# Patient Record
Sex: Male | Born: 1960 | Hispanic: Yes | Marital: Single | Smoking: Never smoker
Health system: Southern US, Community
[De-identification: ages and names within clinical notes are randomized; demographics above are authoritative.]

## PROBLEM LIST (undated history)

## (undated) DIAGNOSIS — E119 Type 2 diabetes mellitus without complications: Secondary | ICD-10-CM

## (undated) HISTORY — PX: APPENDECTOMY: SHX54

---

## 2019-11-02 ENCOUNTER — Encounter (HOSPITAL_COMMUNITY): Payer: Self-pay

## 2019-11-02 ENCOUNTER — Emergency Department (HOSPITAL_COMMUNITY)
Admission: EM | Admit: 2019-11-02 | Discharge: 2019-11-03 | Disposition: A | Discharge status: Home / Self Care | Payer: 59 | Attending: Emergency Medicine | Admitting: Emergency Medicine

## 2019-11-02 ENCOUNTER — Other Ambulatory Visit: Payer: Self-pay

## 2019-11-02 DIAGNOSIS — M609 Myositis, unspecified: Secondary | ICD-10-CM

## 2019-11-02 DIAGNOSIS — R531 Weakness: Secondary | ICD-10-CM | POA: Diagnosis present

## 2019-11-02 DIAGNOSIS — R7401 Elevation of levels of liver transaminase levels: Secondary | ICD-10-CM

## 2019-11-02 DIAGNOSIS — E119 Type 2 diabetes mellitus without complications: Secondary | ICD-10-CM | POA: Diagnosis not present

## 2019-11-02 HISTORY — DX: Type 2 diabetes mellitus without complications: E11.9

## 2019-11-02 LAB — CBC
HCT: 49.7 % (ref 39.0–52.0)
Hemoglobin: 15.7 g/dL (ref 13.0–17.0)
MCH: 27.2 pg (ref 26.0–34.0)
MCHC: 31.6 g/dL (ref 30.0–36.0)
MCV: 86 fL (ref 80.0–100.0)
Platelets: 383 10*3/uL (ref 150–400)
RBC: 5.78 MIL/uL (ref 4.22–5.81)
RDW: 15.2 % (ref 11.5–15.5)
WBC: 9.6 10*3/uL (ref 4.0–10.5)
nRBC: 0 % (ref 0.0–0.2)

## 2019-11-02 LAB — BASIC METABOLIC PANEL
Anion gap: 8 (ref 5–15)
BUN: 7 mg/dL (ref 6–20)
CO2: 24 mmol/L (ref 22–32)
Calcium: 9.2 mg/dL (ref 8.9–10.3)
Chloride: 105 mmol/L (ref 98–111)
Creatinine, Ser: 0.38 mg/dL — ABNORMAL LOW (ref 0.61–1.24)
GFR calc Af Amer: >60 mL/min (ref >60)
GFR calc non Af Amer: >60 mL/min (ref >60)
Glucose, Bld: 124 mg/dL — ABNORMAL HIGH (ref 70–99)
Potassium: 4.5 mmol/L (ref 3.5–5.1)
Sodium: 137 mmol/L (ref 135–145)

## 2019-11-02 LAB — URINALYSIS, ROUTINE W REFLEX MICROSCOPIC
Bilirubin Urine: NEGATIVE
Glucose, UA: NEGATIVE mg/dL
Hgb urine dipstick: NEGATIVE
Ketones, ur: NEGATIVE mg/dL
Leukocytes,Ua: NEGATIVE
Nitrite: NEGATIVE
Protein, ur: NEGATIVE mg/dL
Specific Gravity, Urine: 1.017 (ref 1.005–1.030)
pH: 5 (ref 5.0–8.0)

## 2019-11-02 NOTE — ED Triage Notes (Signed)
Pt arrives POV for eval of weakness x 1.5 months. Pt reports he has difficulty lifting R arm for 1 month as well, no pain just cannot get above head. Pt also reports weakess when rising to standing position. Denies dizziness,N/T.

## 2019-11-03 ENCOUNTER — Emergency Department (HOSPITAL_COMMUNITY): Payer: 59

## 2019-11-03 DIAGNOSIS — R531 Weakness: Secondary | ICD-10-CM

## 2019-11-03 LAB — HEPATITIS PANEL, ACUTE
HCV Ab: NONREACTIVE
Hep A IgM: NONREACTIVE
Hep B C IgM: NONREACTIVE
Hepatitis B Surface Ag: NONREACTIVE

## 2019-11-03 LAB — TSH: TSH: 3.578 u[IU]/mL (ref 0.350–4.500)

## 2019-11-03 LAB — CSF CELL COUNT WITH DIFFERENTIAL
RBC Count, CSF: 9 /mm3 — ABNORMAL HIGH
RBC Count, CSF: 2000 /mm3 — ABNORMAL HIGH
Tube #: 4
Tube #: 1
WBC, CSF: 1 /mm3 (ref 0–5)
WBC, CSF: 4 /mm3 (ref 0–5)

## 2019-11-03 LAB — HEPATIC FUNCTION PANEL
ALT: 391 U/L — ABNORMAL HIGH (ref 0–44)
AST: 268 U/L — ABNORMAL HIGH (ref 15–41)
Albumin: 3.3 g/dL — ABNORMAL LOW (ref 3.5–5.0)
Alkaline Phosphatase: 70 U/L (ref 38–126)
Bilirubin, Direct: <0.1 mg/dL (ref 0.0–0.2)
Total Bilirubin: 1 mg/dL (ref 0.3–1.2)
Total Protein: 6.7 g/dL (ref 6.5–8.1)

## 2019-11-03 LAB — CK: Total CK: 5018 U/L — ABNORMAL HIGH (ref 49–397)

## 2019-11-03 LAB — ACETAMINOPHEN LEVEL: Acetaminophen (Tylenol), Serum: <10 ug/mL — ABNORMAL LOW (ref 10–30)

## 2019-11-03 LAB — CBG MONITORING, ED: Glucose-Capillary: 85 mg/dL (ref 70–99)

## 2019-11-03 LAB — SEDIMENTATION RATE: Sed Rate: 12 mm/hr (ref 0–16)

## 2019-11-03 LAB — HEMOGLOBIN A1C
Hgb A1c MFr Bld: 6.8 % — ABNORMAL HIGH (ref 4.8–5.6)
Mean Plasma Glucose: 148.46 mg/dL

## 2019-11-03 LAB — PROTEIN, CSF: Total  Protein, CSF: 29 mg/dL (ref 15–45)

## 2019-11-03 LAB — GLUCOSE, CSF: Glucose, CSF: 54 mg/dL (ref 40–70)

## 2019-11-03 LAB — C-REACTIVE PROTEIN: CRP: 0.6 mg/dL (ref <1.0)

## 2019-11-03 MED ORDER — IBUPROFEN 400 MG PO TABS
600.0000 mg | ORAL_TABLET | Freq: Once | ORAL | Status: AC
  Administered 2019-11-03: 600 mg via ORAL
  Filled 2019-11-03: qty 1

## 2019-11-03 MED ORDER — LACTATED RINGERS IV BOLUS
1000.0000 mL | Freq: Once | INTRAVENOUS | Status: AC
  Administered 2019-11-03: 1000 mL via INTRAVENOUS

## 2019-11-03 NOTE — Discharge Planning (Signed)
PT recommends outpatient PT and rolling walker.  RNCM placed referral to OP PT and rolling walker.  OP PT will call pt with appointment.

## 2019-11-03 NOTE — ED Notes (Signed)
CSF Samples transported to Main Lab by this RN

## 2019-11-03 NOTE — ED Provider Notes (Signed)
Emergency Department Provider Note  I have reviewed the triage vital signs and the nursing notes.  HISTORY  Chief Complaint Weakness   HPI Maurice Jones is a 59 y.o. male with history of diabetes who presents to the emergency department today secondary to weakness.  Patient states that about 4 months ago he noticed some changes in his overall strength but then a couple months ago when he really started noticing weakness in his thighs.  States he progressively worsened and then recently in the last few weeks he started noticing weakness in his upper extremities as well.  States he had a significant bout of weight loss since Thanksgiving and his sister produces pictures showing that it does appear he is lost at least 30 to 40 pounds if not more since that time.  Patient has no pain anywhere.  No headaches.  No difficulty swallowing or speaking.  No vision changes.  No recent illnesses no illnesses preceding this symptomatology.  No trauma.  No history of drug use, tobacco or alcohol.  Who just got here from [place] however no other recent travels.  He does state that he was able to walk pretty easily when he got here was become much more mature for him.  He is also at the point where it is difficult for him to sit up in bed and to slide across the bed.  No other associated or modifying symptoms.    Past Medical History:  Diagnosis Date  . Diabetes mellitus without complication ([facility])     There are no problems to display for this patient.   History reviewed. No pertinent surgical history.  Current Outpatient Rx  . Order #: [id number] Class: Historical Med    Allergies Patient has no known allergies.  History reviewed. No pertinent family history.  Social History Social History   Tobacco Use  . Smoking status: Never Smoker  Substance Use Topics  . Alcohol use: Yes  . Drug use: Not Currently    Review of Systems  All other systems negative except as documented in the HPI.  All pertinent positives and negatives as reviewed in the HPI. ____________________________________________  PHYSICAL EXAM:  VITAL SIGNS: ED Triage Vitals  Enc Vitals Group     BP 11/02/19 1148 106/77     Pulse Rate 11/02/19 1148 88     Resp 11/02/19 1148 18     Temp 11/02/19 1148 98.8 F (37.1 C)     Temp Source 11/02/19 1735 Oral     SpO2 11/02/19 1148 100 %     Weight 11/02/19 1148 165 lb (74.8 kg)     Height 11/02/19 1148 '5\' 4"'  (1.626 m)    Constitutional: Alert and oriented. Well appearing and in no acute distress. Eyes: Conjunctivae are normal. PERRL. EOMI. Head: Atraumatic. Nose: No congestion/rhinnorhea. Mouth/Throat: Mucous membranes are moist.  Oropharynx non-erythematous. Neck: No stridor.  No meningeal signs.   Cardiovascular: Normal rate, regular rhythm. Good peripheral circulation. Grossly normal heart sounds.   Respiratory: Normal respiratory effort.  No retractions. Lungs CTAB. Gastrointestinal: Soft and nontender. No distention.  Musculoskeletal: No lower extremity tenderness nor edema. No gross deformities of extremities. Neurologic:  Normal speech and language.  He has weakness in grip strength, bicep, tricep symmetrically.  Diminished reflexes in patella and Achilles with symmetric diminished strength in bilateral quadriceps. Skin:  Skin is warm, dry and intact. No rash noted.  ____________________________________________   LABS (all labs ordered are listed, but only abnormal results are displayed)  Labs Reviewed  BASIC METABOLIC PANEL - Abnormal; Notable for the following components:      Result Value   Glucose, Bld 124 (*)    Creatinine, Ser 0.38 (*)    All other components within normal limits  URINALYSIS, ROUTINE W REFLEX MICROSCOPIC - Abnormal; Notable for the following components:   APPearance HAZY (*)    All other components within normal limits  CK - Abnormal; Notable for the following components:   Total CK 5,018 (*)    All other components  within normal limits  HEPATIC FUNCTION PANEL - Abnormal; Notable for the following components:   Albumin 3.3 (*)    AST 268 (*)    ALT 391 (*)    All other components within normal limits  CSF CELL COUNT WITH DIFFERENTIAL - Abnormal; Notable for the following components:   Appearance, CSF HAZY (*)    RBC Count, CSF 2,000 (*)    All other components within normal limits  CSF CELL COUNT WITH DIFFERENTIAL - Abnormal; Notable for the following components:   Appearance, CSF CLEAR (*)    RBC Count, CSF 9 (*)    All other components within normal limits  HEMOGLOBIN A1C - Abnormal; Notable for the following components:   Hgb A1c MFr Bld 6.8 (*)    All other components within normal limits  ACETAMINOPHEN LEVEL - Abnormal; Notable for the following components:   Acetaminophen (Tylenol), Serum <10 (*)    All other components within normal limits  CSF CULTURE  CBC  SEDIMENTATION RATE  C-REACTIVE PROTEIN  PROTEIN, CSF  GLUCOSE, CSF  TSH  HEPATITIS PANEL, ACUTE  IGG CSF INDEX  OLIGOCLONAL BANDS, CSF + SERM  CBG MONITORING, ED   ____________________________________________  EKG   EKG Interpretation  Date/Time:  Thursday November 02 2019 11:52:00 EDT Ventricular Rate:  85 PR Interval:  132 QRS Duration: 98 QT Interval:  360 QTC Calculation: 428 R Axis:   -39 Text Interpretation: Normal sinus rhythm Left axis deviation Possible Lateral infarct , age undetermined Abnormal ECG poor data ecg 2/2 artifact, can't read Confirmed by Merrily Pew [phone]) on 11/02/2019 11:12:08 PM       ____________________________________________  RADIOLOGY  MR BRAIN WO CONTRAST  Result Date: 11/03/2019 CLINICAL DATA:  Difficulty lifting right arm for 1 month.  Weakness. EXAM: MRI HEAD WITHOUT CONTRAST MRI CERVICAL SPINE WITHOUT CONTRAST TECHNIQUE: Multiplanar, multiecho pulse sequences of the brain and surrounding structures, and cervical spine, to include the craniocervical junction and cervicothoracic  junction, were obtained without intravenous contrast. COMPARISON:  None. FINDINGS: MRI HEAD FINDINGS BRAIN: No acute infarct, acute hemorrhage or extra-axial collection. Normal white matter signal. Normal volume of CSF spaces. No chronic microhemorrhage. Normal midline structures. VASCULAR: Major flow voids are preserved. SKULL AND UPPER CERVICAL SPINE: Normal calvarium and skull base. Visualized upper cervical spine and soft tissues are normal. SINUSES/ORBITS: No paranasal sinus fluid levels or advanced mucosal thickening. No mastoid or middle ear effusion. Normal orbits. MRI CERVICAL SPINE FINDINGS Alignment: Physiologic. Vertebrae: No fracture, evidence of discitis, or bone lesion. Cord: Normal signal and morphology. Posterior Fossa, vertebral arteries, paraspinal tissues: Negative. Disc levels: C2-3: Normal. C3-4: Small right subarticular disc protrusion. Mild right foraminal stenosis. No central spinal canal stenosis. C4-5: Small disc bulge.  Moderate right foraminal stenosis. C5-6: Small disc bulge.  No stenosis. C6-7: Unremarkable. C7-T1: Normal. T1-2: Normal. IMPRESSION: 1. Normal MRI of the brain. 2. 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 Electronically Signed   By: Cletus Gash.D.  On: 11/03/2019 02:33   MR Cervical Spine Wo Contrast  Result Date: 11/03/2019 CLINICAL DATA:  Difficulty lifting right arm for 1 month.  Weakness. EXAM: MRI HEAD WITHOUT CONTRAST MRI CERVICAL SPINE WITHOUT CONTRAST TECHNIQUE: Multiplanar, multiecho pulse sequences of the brain and surrounding structures, and cervical spine, to include the craniocervical junction and cervicothoracic junction, were obtained without intravenous contrast. COMPARISON:  None. FINDINGS: MRI HEAD FINDINGS BRAIN: No acute infarct, acute hemorrhage or extra-axial collection. Normal white matter signal. Normal volume of CSF spaces. No chronic microhemorrhage. Normal midline structures. VASCULAR: Major flow voids are  preserved. SKULL AND UPPER CERVICAL SPINE: Normal calvarium and skull base. Visualized upper cervical spine and soft tissues are normal. SINUSES/ORBITS: No paranasal sinus fluid levels or advanced mucosal thickening. No mastoid or middle ear effusion. Normal orbits. MRI CERVICAL SPINE FINDINGS Alignment: Physiologic. Vertebrae: No fracture, evidence of discitis, or bone lesion. Cord: Normal signal and morphology. Posterior Fossa, vertebral arteries, paraspinal tissues: Negative. Disc levels: C2-3: Normal. C3-4: Small right subarticular disc protrusion. Mild right foraminal stenosis. No central spinal canal stenosis. C4-5: Small disc bulge.  Moderate right foraminal stenosis. C5-6: Small disc bulge.  No stenosis. C6-7: Unremarkable. C7-T1: Normal. T1-2: Normal. IMPRESSION: 1. Normal MRI of the brain. 2. 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 Electronically Signed   By: Ulyses Jarred M.D.   On: 11/03/2019 02:33   ____________________________________________  PROCEDURES  Procedure(s) performed:   .Lumbar Puncture  Date/Time: 11/03/2019 11:54 PM Performed by: Merrily Pew, MD Authorized by: Merrily Pew, MD   Consent:    Consent obtained:  Verbal   Consent given by:  Patient   Risks discussed:  Bleeding, headache, infection, nerve damage, repeat procedure and pain   Alternatives discussed:  No treatment, delayed treatment and alternative treatment Pre-procedure details:    Procedure purpose:  Diagnostic Anesthesia (see MAR for exact dosages):    Anesthesia method:  Local infiltration   Local anesthetic:  Lidocaine 1% w/o epi Procedure details:    Lumbar space:  L3-L4 interspace   Patient position:  Sitting   Needle gauge:  22   Needle type:  Spinal needle - Quincke tip   Needle length (in):  3.5   Number of attempts:  2   Fluid appearance:  Blood-tinged   Tubes of fluid:  4   Total volume (ml):  6 Post-procedure:    Puncture site:  Adhesive bandage applied and  direct pressure applied   Patient tolerance of procedure:  Tolerated well, no immediate complications   ____________________________________________  INITIAL IMPRESSION / ASSESSMENT AND PLAN / ED COURSE   This patient presents to the ED for concern of weakness, this involves an extensive number of treatment options, and is a complaint that carries with it a high risk of complications and morbidity.  The differential diagnosis includes ALS, GBS, muscular dystrophy, MG, MS.     Lab Tests:   I Ordered, reviewed, and interpreted labs, which included CSF which was unremarkable, ESR, CRP both unremarkable.  Elevated CK around 5000 along with elevated liver enzymes.  Discussed with neurology evaluate the patient that the patient needed likely neuromuscular work-up as an outpatient.  Will refer to rheumatology, neurology and ask case management physical therapy to see the patient here before leaving to see if there is anything they can do to help with mobility at home.  Imaging Studies ordered:   I independently visualized and interpreted imaging MRI brain and C-spine which showed no acute abnormalities  Additional  history obtained:   Additional history obtained from sister  Previous records obtained and reviewed in care everywhere  Consultations Obtained:   I consulted Neurology  and discussed lab and imaging findings  Reevaluation:  After the interventions stated above, I reevaluated the patient and found no significant change. Is able to stand and get around. Still unclear on etiology of symptoms, but does have some inconsistent history making me think it is not as rapidly progressive as he first made it out to be. Neuro recommending PT/OT eval for outpatient treatment. Medicine recommends Rheum consult and we don't have anyone on call in our system, so will refer to Dr. Amil Amen in the community.   A medical screening exam was performed and I feel the patient has had an appropriate  workup for their chief complaint at this time and likelihood of emergent condition existing is low. They have been counseled on decision, discharge, follow up and which symptoms necessitate immediate return to the emergency department. They or their family verbally stated understanding and agreement with plan and discharged in stable condition.   ____________________________________________  FINAL CLINICAL IMPRESSION(S) / ED DIAGNOSES  Final diagnoses:  Weakness  Myositis, unspecified myositis type, unspecified site  Transaminitis    MEDICATIONS GIVEN DURING THIS VISIT:  Medications  lactated ringers bolus 1,000 mL (0 mLs Intravenous Stopped 11/03/19 0704)  ibuprofen (ADVIL) tablet 600 mg (600 mg Oral Given 11/03/19 1002)    NEW OUTPATIENT MEDICATIONS STARTED DURING THIS VISIT:  Discharge Medication List as of 11/03/2019 12:33 PM      Note:  This note was prepared with assistance of Dragon voice recognition software. Occasional wrong-word or sound-a-like substitutions may have occurred due to the inherent limitations of voice recognition software.   Merrily Pew, MD 11/03/19 [phone]

## 2019-11-03 NOTE — ED Notes (Signed)
Pt transported to MRI 

## 2019-11-03 NOTE — ED Notes (Signed)
Pt returned from MRI; MD Aroor contacted regarding pts arrival.

## 2019-11-03 NOTE — Consult Note (Signed)
Requesting Physician: Dr. Clayborne Dana    Chief Complaint: Generalized weakness, muscle twitches, weight loss  History obtained from: Patient and Chart    HPI:                                                                                                                                       Maurice Jones is a 59 y.o. male with past medical history significant for diabetes mellitus on Metformin brought into the emergency department by his daughter for increasing weakness and difficulty walking and getting up from bed.  The patient lives in [place] and visiting his daughters who live in [place].  Patient has been having increased weakness, muscle wasting over the last 6 months.  His friend/fianc has noticed him also having occasional twitches for about a year.  The daughter went to visit him last week in [place] and noticed that he had lost significant muscle mass in his arms and legs and was concerned and brought him to [place].  She states that he was able to walk into the airplane, but since he has been with her he is having increasing difficulty ambulating and needs assistance getting up from the chair.  After getting from the chair he is able to walk without a walker.  Patient denies any sensory symptoms apart from some mild numbness tingling in both his feet.  He also has noticed that he is having pooling of saliva in his throat.  He does not have any difficulty with eating.  Denies double vision, slurred speech.  He has recently started back on Metformin however has been off this medicine for about 2 years.  His blood glucose is 85.     Past Medical History:  Diagnosis Date   Diabetes mellitus without complication ([facility])     History reviewed. No pertinent surgical history.  History reviewed. No pertinent family history. Social History:  reports that he has never smoked. He does not have any smokeless tobacco history on file. He reports current alcohol use. He reports previous  drug use.  Allergies: No Known Allergies  Medications:                                                                                                                        I reviewed home medications  ROS:  14 systems reviewed and negative except above   Examination:                                                                                                      General: Appears well-developed and well-nourished.  Psych: Affect appropriate to situation Eyes: No scleral injection HENT: No OP obstrucion Head: Normocephalic.  Cardiovascular: Normal rate and regular rhythm.  Respiratory: Effort normal and breath sounds normal to anterior ascultation GI: Soft.  No distension. There is no tenderness.  Skin: WDI    Neurological Examination Mental Status: Alert, oriented, thought content appropriate.  Speech fluent without evidence of aphasia. Able to follow 3 step commands without difficulty. Cranial Nerves: II: Visual fields grossly normal,  III,IV, VI: ptosis not present, extra-ocular motions intact bilaterally, pupils equal, round, reactive to light and accommodation V,VII: smile symmetric, facial light touch sensation normal bilaterally VIII: hearing normal bilaterally IX,X: uvula rises symmetrically XI: bilateral shoulder shrug XII: midline tongue extension Motor: Right : Upper extremity   5/5    Left:     Upper extremity   5/5  Lower extremity   4+/5     Lower extremity   4+/5 Tone and bulk: Patient has wasting in bilateral thighs.  No obvious fasciculations noted. Sensory: Pinprick and light touch intact throughout, bilaterally Deep Tendon Reflexes: Absent reflexes throughout (bilateral plantars, ankle, bilateral biceps, triceps and brachioradialis).  No jaw jerk present Plantars: Right: downgoing   Left: downgoing Cerebellar: normal  finger-to-nose, normal rapid alternating movements and normal heel-to-shin test Gait: Not assessed     Lab Results: Basic Metabolic Panel: Recent Labs  Lab 11/02/19 1156  NA 137  K 4.5  CL 105  CO2 24  GLUCOSE 124*  BUN 7  CREATININE 0.38*  CALCIUM 9.2    CBC: Recent Labs  Lab 11/02/19 1156  WBC 9.6  HGB 15.7  HCT 49.7  MCV 86.0  PLT 383    Coagulation Studies: No results for input(s): LABPROT, INR in the last 72 hours.  Imaging: MR BRAIN WO CONTRAST  Result Date: 11/03/2019 CLINICAL DATA:  Difficulty lifting right arm for 1 month.  Weakness. EXAM: MRI HEAD WITHOUT CONTRAST MRI CERVICAL SPINE WITHOUT CONTRAST TECHNIQUE: Multiplanar, multiecho pulse sequences of the brain and surrounding structures, and cervical spine, to include the craniocervical junction and cervicothoracic junction, were obtained without intravenous contrast. COMPARISON:  None. FINDINGS: MRI HEAD FINDINGS BRAIN: No acute infarct, acute hemorrhage or extra-axial collection. Normal white matter signal. Normal volume of CSF spaces. No chronic microhemorrhage. Normal midline structures. VASCULAR: Major flow voids are preserved. SKULL AND UPPER CERVICAL SPINE: Normal calvarium and skull base. Visualized upper cervical spine and soft tissues are normal. SINUSES/ORBITS: No paranasal sinus fluid levels or advanced mucosal thickening. No mastoid or middle ear effusion. Normal orbits. MRI CERVICAL SPINE FINDINGS Alignment: Physiologic. Vertebrae: No fracture, evidence of discitis, or bone lesion. Cord: Normal signal and morphology. Posterior Fossa, vertebral arteries, paraspinal tissues: Negative. Disc levels: C2-3: Normal. C3-4: Small right subarticular disc protrusion. Mild right foraminal stenosis. No central spinal canal stenosis. C4-5: Small disc  bulge.  Moderate right foraminal stenosis. C5-6: Small disc bulge.  No stenosis. C6-7: Unremarkable. C7-T1: Normal. T1-2: Normal. IMPRESSION: 1. Normal MRI of the brain. 2.  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 Electronically Signed   By: Deatra Robinson M.D.   On: 11/03/2019 02:33   MR Cervical Spine Wo Contrast  Result Date: 11/03/2019 CLINICAL DATA:  Difficulty lifting right arm for 1 month.  Weakness. EXAM: MRI HEAD WITHOUT CONTRAST MRI CERVICAL SPINE WITHOUT CONTRAST TECHNIQUE: Multiplanar, multiecho pulse sequences of the brain and surrounding structures, and cervical spine, to include the craniocervical junction and cervicothoracic junction, were obtained without intravenous contrast. COMPARISON:  None. FINDINGS: MRI HEAD FINDINGS BRAIN: No acute infarct, acute hemorrhage or extra-axial collection. Normal white matter signal. Normal volume of CSF spaces. No chronic microhemorrhage. Normal midline structures. VASCULAR: Major flow voids are preserved. SKULL AND UPPER CERVICAL SPINE: Normal calvarium and skull base. Visualized upper cervical spine and soft tissues are normal. SINUSES/ORBITS: No paranasal sinus fluid levels or advanced mucosal thickening. No mastoid or middle ear effusion. Normal orbits. MRI CERVICAL SPINE FINDINGS Alignment: Physiologic. Vertebrae: No fracture, evidence of discitis, or bone lesion. Cord: Normal signal and morphology. Posterior Fossa, vertebral arteries, paraspinal tissues: Negative. Disc levels: C2-3: Normal. C3-4: Small right subarticular disc protrusion. Mild right foraminal stenosis. No central spinal canal stenosis. C4-5: Small disc bulge.  Moderate right foraminal stenosis. C5-6: Small disc bulge.  No stenosis. C6-7: Unremarkable. C7-T1: Normal. T1-2: Normal. IMPRESSION: 1. Normal MRI of the brain. 2. 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 Electronically Signed   By: Deatra Robinson M.D.   On: 11/03/2019 02:33     I have reviewed the above imaging: MRI brain and C-spine shows no acute findings.   ASSESSMENT AND PLAN   59 year old male with diabetes mellitus presents with 62-month history  of progressive weakness, weight loss and muscle wasting (proximal much greater than distal).  Patient areflexic on exam and CK elevated at 5000.  No sensory symptoms and on examination minimal reduced sensation in feet.  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  No compressive or demyelinating lesions. Obtain lumbar puncture to look for albumino-cytological dissociation. PT OT evaluation We will need outpatient EMG, nerve conduction study and possible muscle biopsy. Would hold off starting steroids given chronic process.   Addendum LP CSF protein : 29, Cell count 1- r/o CIDP Recommend PT eval, if cleared can be d.c home with referall to N[facility]/EMG to [facility] or [facility] Mera Gunkel [facility] Pager Number [phone]

## 2019-11-03 NOTE — ED Notes (Signed)
Lunch Tray Ordered @ 1043. 

## 2019-11-03 NOTE — Discharge Planning (Signed)
Transition of Care [facility]) - Emergency Department Mini Assessment   Patient Details  Name: Mackinley Kiehn MRN: [id number] Date of Birth: 05/24/1960  [facility]) CM/SW Contact:    Fuller Mandril, RN Phone Number: 11/03/2019, 8:58 AM   Clinical Narrative: RNCM met with pt at bedside regarding disposition planning.  Pt is visiting his sister from [place] and does plan to return to [place].  Pt awaiting PT evaluation for discharge recommendations.  RNCM will follow-up with pt.    ED Mini Assessment: What brought you to the Emergency Department? : general weakness  Barriers to Discharge: Other (comment), Inadequate or no insurance (awaiting PT evaluation)     Means of departure: Car  Interventions which prevented an admission or readmission: Medication Review, Follow-up medical appointment    Patient Contact and Communications Key Contact 1: Weston Settle      ,                 Admission diagnosis:  Weakness There are no problems to display for this patient.  PCP:  System, Pcp Not In Pharmacy:   [facility] [address] [place] [address] Phone: [phone] Fax: [phone]

## 2019-11-03 NOTE — ED Provider Notes (Signed)
Patient was seen by physical therapy, plan is for outpatient therapy.  Additional follow-up as planned.  Rolling walker supplied to patient and patient is ready for discharge.   Jeannie Fend, PA-C 11/03/19 1241    Mesner, Barbara Cower, MD 11/03/19 [phone]

## 2019-11-03 NOTE — Evaluation (Signed)
Physical Therapy Evaluation Patient Details Name: Maurice Jones MRN: [id number] DOB: October 13, 1960 Today's Date: 11/03/2019   History of Present Illness  Pt is a 59 y/o male presenting to the ED secondary to progressive weakness.CT negative for acute abnormality. Pt is s/p lumbar puncture. PMH includes DM.   Clinical Impression  Pt admitted secondary to problem above with deficits below. Pt presenting with bilateral LE weakness and decreased strength/ROM in BUE (RLE>LUE). Pt requiring min guard A for mobility tasks. Educated about safe stair navigation and use of RW if noted in have increased weakness or instability at home. Feel he would benefit from outpatient PT at d/c. Will continue to follow acutely to maximize functional mobility independence and safety.     Follow Up Recommendations Outpatient PT    Equipment Recommendations  Rolling walker with 5" wheels    Recommendations for Other Services       Precautions / Restrictions Precautions Precautions: Fall Precaution Comments: Reports he has fallen when he turns too fast or when he bends too far down.  Restrictions Weight Bearing Restrictions: No      Mobility  Bed Mobility               General bed mobility comments: Sitting in chair upon entry.   Transfers Overall transfer level: Needs assistance Equipment used: None Transfers: Sit to/from Stand Sit to Stand: Supervision         General transfer comment: Supervision for safety. Increased time required to stand secondary to weakness.   Ambulation/Gait Ambulation/Gait assistance: Min guard Gait Distance (Feet): 100 Feet Assistive device: None Gait Pattern/deviations: Step-through pattern;Decreased stride length;Wide base of support Gait velocity: Decreased   General Gait Details: Wide BOS with waddle type gait. Mild instability noted, however, no overt LOB noted. Educated about using RW when feeling more unsteady at home and pt agreeable.    Stairs Stairs: Yes       General stair comments: Educated about using sideways technique for stair navigation.   Wheelchair Mobility    Modified Rankin (Stroke Patients Only)       Balance Overall balance assessment: Mild deficits observed, not formally tested                                           Pertinent Vitals/Pain Pain Assessment: Faces Faces Pain Scale: Hurts even more Pain Location: R shoulder with movement Pain Descriptors / Indicators: Aching;Grimacing Pain Intervention(s): Monitored during session;Limited activity within patient's tolerance;Repositioned    Home Living Family/patient expects to be discharged to:: Private residence Living Arrangements: Other relatives (sister) Available Help at Discharge: Family;Available PRN/intermittently Type of Home: House Home Access: Stairs to enter Entrance Stairs-Rails: Left Entrance Stairs-Number of Steps: 5 Home Layout: One level Home Equipment: None      Prior Function Level of Independence: Independent               Hand Dominance        Extremity/Trunk Assessment   Upper Extremity Assessment Upper Extremity Assessment: RUE deficits/detail;LUE deficits/detail RUE Deficits / Details: Pt with very limited AROM and PROM. Only able to elevated to ~90 degrees. Unable to move passively past ~90 degrees either. Reports pain at end range LUE Deficits / Details: Able to elevate a little past shoulder height both passively and actively.     Lower Extremity Assessment Lower Extremity Assessment: LLE deficits/detail;RLE deficits/detail RLE Deficits / Details:  Hip flexors at 2/5, knee extensors at 4/5. Noted ankle swelling.  LLE Deficits / Details: Hip flexors at 2/5, knee extensors at 4/5. Noted ankle swelling as well.     Cervical / Trunk Assessment Cervical / Trunk Assessment: Normal  Communication   Communication: No difficulties  Cognition Arousal/Alertness:  Awake/alert Behavior During Therapy: WFL for tasks assessed/performed Overall Cognitive Status: Within Functional Limits for tasks assessed                                        General Comments General comments (skin integrity, edema, etc.): Educated about ankle stretching.     Exercises     Assessment/Plan    PT Assessment Patient needs continued PT services  PT Problem List Decreased strength;Decreased balance;Decreased mobility       PT Treatment Interventions Gait training;DME instruction;Stair training;Functional mobility training;Therapeutic activities;Therapeutic exercise;Balance training;Patient/family education    PT Goals (Current goals can be found in the Care Plan section)  Acute Rehab PT Goals Patient Stated Goal: to figure out where the weakness is coming from PT Goal Formulation: With patient Time For Goal Achievement: 11/17/19 Potential to Achieve Goals: Good    Frequency Min 3X/week   Barriers to discharge        Co-evaluation               AM-PAC PT "6 Clicks" Mobility  Outcome Measure Help needed turning from your back to your side while in a flat bed without using bedrails?: None Help needed moving from lying on your back to sitting on the side of a flat bed without using bedrails?: None Help needed moving to and from a bed to a chair (including a wheelchair)?: A Little Help needed standing up from a chair using your arms (e.g., wheelchair or bedside chair)?: None Help needed to walk in hospital room?: A Little Help needed climbing 3-5 steps with a railing? : A Lot 6 Click Score: 20    End of Session Equipment Utilized During Treatment: Gait belt Activity Tolerance: Patient tolerated treatment well Patient left: in bed;with call bell/phone within reach (sitting EOB ) Nurse Communication: Mobility status PT Visit Diagnosis: Unsteadiness on feet (R26.81);Muscle weakness (generalized) (M62.81)    Time: 5284-1324 PT Time  Calculation (min) (ACUTE ONLY): 17 min   Charges:   PT Evaluation $PT Eval Moderate Complexity: 1 Mod          Farley Ly, PT, DPT  Acute Rehabilitation Services  Pager: [phone] Office: [phone]   Lehman Prom 11/03/2019, 11:54 AM

## 2019-11-06 LAB — CSF CULTURE W GRAM STAIN
Culture: NO GROWTH
Gram Stain: NONE SEEN

## 2019-11-07 LAB — OLIGOCLONAL BANDS, CSF + SERM

## 2019-11-08 LAB — IGG CSF INDEX
Albumin CSF-mCnc: 15 mg/dL (ref 15–55)
Albumin: 3.2 g/dL — ABNORMAL LOW (ref 3.8–4.9)
CSF IgG Index: 0.5 (ref 0.0–0.7)
IgG (Immunoglobin G), Serum: 1237 mg/dL (ref 603–1613)
IgG, CSF: 2.7 mg/dL (ref 0.0–10.3)
IgG/Alb Ratio, CSF: 0.18 (ref 0.00–0.25)

## 2019-11-09 ENCOUNTER — Other Ambulatory Visit: Payer: Self-pay | Admitting: Rheumatology

## 2019-11-09 DIAGNOSIS — R748 Abnormal levels of other serum enzymes: Secondary | ICD-10-CM

## 2019-11-09 DIAGNOSIS — M60859 Other myositis, unspecified thigh: Secondary | ICD-10-CM

## 2019-11-15 ENCOUNTER — Other Ambulatory Visit: Payer: Self-pay

## 2019-11-15 ENCOUNTER — Ambulatory Visit: Payer: 59 | Attending: Emergency Medicine | Admitting: Physical Therapy

## 2019-11-15 ENCOUNTER — Encounter: Payer: Self-pay | Admitting: Physical Therapy

## 2019-11-15 DIAGNOSIS — M25612 Stiffness of left shoulder, not elsewhere classified: Secondary | ICD-10-CM | POA: Insufficient documentation

## 2019-11-15 DIAGNOSIS — R2689 Other abnormalities of gait and mobility: Secondary | ICD-10-CM | POA: Diagnosis present

## 2019-11-15 DIAGNOSIS — M6281 Muscle weakness (generalized): Secondary | ICD-10-CM | POA: Insufficient documentation

## 2019-11-15 DIAGNOSIS — M25611 Stiffness of right shoulder, not elsewhere classified: Secondary | ICD-10-CM | POA: Diagnosis present

## 2019-11-15 NOTE — Patient Instructions (Signed)
Access Code: [email or web address] URL: [email or web address] Date: 11/15/2019 Prepared by: Rosana Hoes  Exercises Supine Bridge - 2-3 x daily - 7 x weekly - 10-15 reps Clamshell - 2-3 x daily - 7 x weekly - 10-15 reps Hooklying Single Knee to Chest Stretch - 2-3 x daily - 7 x weekly - 3 reps - 20 seconds hold Supine Heel Slide with Strap - 2-3 x daily - 7 x weekly - 3 reps - 20 seconds hold Seated Hamstring Stretch - 2-3 x daily - 7 x weekly - 3 reps - 20 seconds hold Seated Long Arc Quad - 2-3 x daily - 7 x weekly - 10-15 reps Seated Heel Toe Raises - 2-3 x daily - 7 x weekly - 20 reps Seated Bilateral Shoulder Flexion Towel Slide at Table Top - 2-3 x daily - 7 x weekly - 10 reps - 10 seconds hold Seated Shoulder Row with Anchored Resistance - 2-3 x daily - 7 x weekly - 10-15 reps Heel rises with counter support - 2-3 x daily - 7 x weekly - 10-15 reps Standing Hip Abduction with Counter Support - 2-3 x daily - 7 x weekly - 10-15 reps Standing Marching - 2-3 x daily - 7 x weekly - 10-15 reps Standing Hamstring Curl with Chair Support - 2-3 x daily - 7 x weekly - 10-15 reps

## 2019-11-15 NOTE — Therapy (Addendum)
[place], [place], [address] Phone: [phone]   Fax:  [phone]  Physical Therapy Evaluation / Discharge  Patient Details  Name: Maurice Jones MRN: [id number] Date of Birth: [birth year] Referring Provider (PT): Sherwood Gambler, MD   Encounter Date: 11/15/2019   PT End of Session - 11/15/19 1256    Visit Number 1    Number of Visits 8    Date for PT Re-Evaluation 01/10/20    Authorization Type [employer]IC [employer]    PT Start Time 1054   patient arrived late   PT Stop Time 1130    PT Time Calculation (min) 36 min    Activity Tolerance Patient tolerated treatment well    Behavior During Therapy [facility] for tasks assessed/performed           Past Medical History:  Diagnosis Date  . Diabetes mellitus without complication ([place])     History reviewed. No pertinent surgical history.  There were no vitals filed for this visit.    Subjective Assessment - 11/15/19 1052    Subjective Patient reports he is having weakness where he can't stand from a chair or lift his arms over his head. He needs help to stand but is able to walk. He can't lift his arms over shoulder height and has trouble reaching behind his back. Patient reports this has been going on for a few months, but prior to this he felt he was perfectly fine. He notes pain occasionally in the calf region, and he is having swelling in his ankle. He also notes some right shoulder pain when he has been lying on the right side for extended periods. He reports he exercises every now and then, but is unable to do bery much.    Limitations Lifting;Standing;Walking;House hold activities    How long can you sit comfortably? 2-3 hours    How long can you stand comfortably? Patient reports he can stand for as long as he wants    How long can you walk comfortably? Patient reports he can walk for as long as he wants    Currently in Pain? No/denies               [facility] PT Assessment - 11/15/19 0001      Assessment   Medical Diagnosis Weakness    Referring Provider (PT) Sherwood Gambler, MD    Onset Date/Surgical Date --   patient reports approximately 3-6 month onset   Hand Dominance Right    Next MD Visit 8/202/2021 - Neurologist with [facility]    Prior Therapy None      Precautions   Precautions None      Restrictions   Weight Bearing Restrictions No      Balance Screen   Has the patient fallen in the past 6 months No    Has the patient had a decrease in activity level because of a fear of falling?  No    Is the patient reluctant to leave their home because of a fear of falling?  No      Home Social worker Private residence    Living Arrangements Other relatives   Sister   Type of [place] to enter    Entrance Stairs-Number of Steps 4    Entrance Stairs-Rails Right    [place] One level      Prior Function   Level of Independence Independent  Vocation Full time employment    Geologist, engineering    Leisure None reported (working)      Cognition   Overall Cognitive Status Within Functional Limits for tasks assessed      Observation/Other Assessments   Observations Patient appears in no apparent distress    Focus on Therapeutic Outcomes (FOTO)  55% limitation      Observation/Other Assessments-Edema    Edema --   patient exhibits bilateral ankle/foot edema     Sensation   Light Touch Appears Intact      Coordination   Gross Motor Movements are Fluid and Coordinated Yes      Functional Tests   Functional tests Sit to Stand      Sit to Stand   Comments Patient exhibits significant difficulty to rise independently from standard height chair, requires armrest and BUE assist with occasional min assist; he is able to stand from higher surface with minimal use of BUEs      Posture/Postural Control   Posture Comments Patient exhibits rounded  shoulder and forward head posture      ROM / Strength   AROM / PROM / Strength Strength;AROM;PROM      PROM   Overall PROM Comments Hip and knee PROM grossly WFL and non painful, patient does report muscular tightness    PROM Assessment Site Hip;Knee;Shoulder    Right/Left Shoulder Right;Left    Right Shoulder Flexion 90 Degrees    Right Shoulder ABduction 80 Degrees    Right Shoulder External Rotation 15 Degrees    Left Shoulder Flexion 105 Degrees    Left Shoulder ABduction 75 Degrees    Left Shoulder External Rotation 30 Degrees      Strength   Overall Strength Comments Shoulder strength within available range    Strength Assessment Site Hip;Knee;Ankle;Shoulder    Right/Left Shoulder Right;Left    Right Shoulder Flexion 3/5    Right Shoulder ABduction 3/5    Right Shoulder External Rotation 3/5    Left Shoulder Flexion 3/5    Left Shoulder Extension 3/5    Left Shoulder External Rotation 3/5    Right/Left Hip Right;Left    Right Hip Flexion 3-/5    Right Hip Extension 3/5    Right Hip ABduction 3-/5    Left Hip Flexion 3-/5    Left Hip Extension 3/5    Left Hip ABduction 3-/5    Right/Left Knee Right;Left    Right Knee Flexion 4/5    Right Knee Extension 4/5    Left Knee Flexion 4/5    Left Knee Extension 4/5    Right/Left Ankle Right;Left    Right Ankle Dorsiflexion 4/5    Right Ankle Plantar Flexion 4-/5    Right Ankle Inversion 4/5    Right Ankle Eversion 4/5    Left Ankle Dorsiflexion 4/5    Left Ankle Plantar Flexion 4-/5    Left Ankle Inversion 4/5    Left Ankle Eversion 4/5      Flexibility   Soft Tissue Assessment /Muscle Length yes    Hamstrings Limited bilaterally    Quadriceps Limited bilaterally      Palpation   Spinal mobility Not assessed    Palpation comment Non-TTP      Special Tests   Other special tests None performed      Bed Mobility   Bed Mobility Sit to Supine;Supine to Sit    Supine to Sit --   Patient requires assist to raise to  sitting position  Sit to Supine --   Patient requires assist to lift legs onto mat table     Transfers   Transfers Sit to Stand    Sit to Stand --   occasional min assist from standard chair     Ambulation/Gait   Ambulation/Gait Yes    Ambulation/Gait Assistance 7: Independent    Gait Comments Patient exhibits bilateral toe out/external rotation, increased trunk sway and trendelenburg                      Objective measurements completed on examination: See above findings.       [facility] Adult PT Treatment/Exercise - 11/15/19 0001      Exercises   Exercises Shoulder;Knee/Hip      Knee/Hip Exercises: Stretches   Passive Hamstring Stretch 20 seconds    Passive Hamstring Stretch Limitations seated edge of mat    Quad Stretch 20 seconds    Quad Stretch Limitations supine heel slide with strap    Other Knee/Hip Stretches Single knee to chest stretch x20 sec      Knee/Hip Exercises: Standing   Heel Raises 10 reps    Knee Flexion 10 reps    Knee Flexion Limitations hamstring curl    Hip Flexion 10 reps    Hip Flexion Limitations alternating marching    Hip Abduction 10 reps      Knee/Hip Exercises: Seated   Long Arc Quad 10 reps    Other Seated Knee/Hip Exercises Heel-toe raise x20      Knee/Hip Exercises: Supine   Bridges 10 reps      Knee/Hip Exercises: Sidelying   Clams x10      Shoulder Exercises: Seated   Row 10 reps    Theraband Level (Shoulder Row) Level 1 (Yellow)      Shoulder Exercises: Stretch   Table Stretch - Flexion 2 reps;10 seconds                  PT Education - 11/15/19 1132    Education Details Exam findings, POC, HEP, uncleart etiology of symptoms    Person(s) Educated Patient    Methods Explanation;Demonstration;Tactile cues;Verbal cues;Handout    Comprehension Verbalized understanding;Returned demonstration;Verbal cues required;Tactile cues required;Need further instruction            PT Short Term Goals - 11/15/19  1318      PT SHORT TERM GOAL #1   Title Patient will be I with initial HEP to progress with PT    Time 4    Period Weeks    Status New    Target Date 12/13/19      PT SHORT TERM GOAL #2   Title Patient will be able to stand from standard height chair without UE assist to improve transfers    Time 4    Period Weeks    Status New    Target Date 12/13/19             PT Long Term Goals - 11/15/19 1320      PT LONG TERM GOAL #1   Title Patient will be I with final HEP to maintain progress from PT    Time 8    Period Weeks    Status New    Target Date 01/10/20      PT LONG TERM GOAL #2   Title Patient will report improved functional level to </= 40% limitation on FOTO    Time 8    Period Weeks  Status New    Target Date 01/10/20      PT LONG TERM GOAL #3   Title Patient will exhibit gross BLE strength to >/= 4/5 MMT to improve walking and stair negotiation    Time 8    Period Weeks    Status New    Target Date 01/10/20      PT LONG TERM GOAL #4   Title Patient will exhibit improved bilateral shoulder elevation to >/= 120 deg to improve reaching overhead and dressing/bathing    Time 8    Period Weeks    Status New    Target Date 01/10/20                  Plan - 11/15/19 1302    Clinical Impression Statement Patient presents to PT with report of muscular weakness that began approximately 3-6 months ago with no apparent mechanism. Based on history and exam, it is unclear etiology of patient's symptoms. Currently he exhibits generalized strength deficit with difficulty transfers, standing and walking, limitation with shoulder passive and active motion, difficulty with ADLs such as dressing and bathing, and increased bilateral ankle and foot edema. Patient was provided with initial exercise program to work on improving his motion and strength. He is scheduled for a Neuology evaluation to assess reason for weakness and patient was instructed to schedule follow-up  appointments following that visit. He would benefit from continued skilled PT to progress his strength in order to improve his ability to stand from a chair, walk without limitation, raise his arms overhead and maximize functional level.    Personal Factors and Comorbidities Time since onset of injury/illness/exacerbation;Past/Current Experience;Comorbidity 1    Comorbidities DM    Examination-Activity Limitations Bathing;Locomotion Level;Transfers;Reach Overhead;Bed Mobility;Sit;Carry;Squat;Dressing;Lift;Hygiene/Grooming;Stand;Stairs    Examination-Participation Restrictions Meal Prep;Occupation;Community Activity;Driving;Shop;Laundry;Yard Work    Merchant navy officer Evolving/Moderate complexity    Clinical Decision Making Moderate    Rehab Potential Good    PT Frequency 1x / week    PT Duration 8 weeks    PT Treatment/Interventions ADLs/Self Care Home Management;Cryotherapy;Electrical Stimulation;Moist Heat;Neuromuscular re-education;Balance training;Therapeutic exercise;Therapeutic activities;Patient/family education;Functional mobility training;Stair training;Gait training;Manual techniques;Passive range of motion;Dry needling;Joint Manipulations;Spinal Manipulations;Taping    PT Next Visit Plan Assess HEP and progress PRN, generalized muscular strengthening, shoulder mobility    PT Home Exercise Plan [email or web address]: bridge, heel slide with strap, supine SKTC, clamshell, LAQ, seated hamstring stretch, seated heel-toe raises, seated row with yellow, seated table slide flexion, standing heel raises, hip abduction, marching, hamstring curl    Consulted and Agree with Plan of Care Patient           Patient will benefit from skilled therapeutic intervention in order to improve the following deficits and impairments:  Abnormal gait, Difficulty walking, Decreased range of motion, Impaired UE functional use, Pain, Postural dysfunction, Decreased strength, Increased edema, Decreased mobility,  Impaired flexibility  Visit Diagnosis: Muscle weakness (generalized)  Stiffness of left shoulder, not elsewhere classified  Stiffness of right shoulder, not elsewhere classified  Other abnormalities of gait and mobility     Problem List There are no problems to display for this patient.   Hilda Blades, PT, DPT, LAT, ATC 11/15/19  1:25 PM Phone: [phone] Fax: [facility] [address] [place], [place], [address] Phone: [phone]   Fax:  [phone]  Name: Burl Tauzin MRN: [id number] Date of Birth: Jan 18, 1961    PHYSICAL THERAPY DISCHARGE SUMMARY  Visits from Start of Care: 1  Current functional  level related to goals / functional outcomes: See above, patient did not return to PT   Remaining deficits: See above, patient did not return to PT   Education / Equipment: HEP  Plan: Patient agrees to discharge.  Patient goals were not met. Patient is being discharged due to not returning since the last visit.  ?????     Hilda Blades, PT, DPT, LAT, ATC 01/29/20  4:39 PM Phone: [phone] Fax: [phone]

## 2019-11-21 ENCOUNTER — Other Ambulatory Visit: Payer: Self-pay | Admitting: Rheumatology

## 2019-11-22 ENCOUNTER — Ambulatory Visit
Admission: RE | Admit: 2019-11-22 | Discharge: 2019-11-22 | Disposition: A | Discharge status: Home / Self Care | Payer: PRIVATE HEALTH INSURANCE | Source: Ambulatory Visit | Attending: Rheumatology | Admitting: Rheumatology

## 2019-11-22 ENCOUNTER — Other Ambulatory Visit: Payer: Self-pay

## 2019-11-22 DIAGNOSIS — R748 Abnormal levels of other serum enzymes: Secondary | ICD-10-CM

## 2019-11-22 DIAGNOSIS — M60859 Other myositis, unspecified thigh: Secondary | ICD-10-CM

## 2019-12-18 ENCOUNTER — Other Ambulatory Visit (HOSPITAL_COMMUNITY)
Admission: RE | Admit: 2019-12-18 | Discharge: 2019-12-18 | Disposition: A | Discharge status: Home / Self Care | Payer: PRIVATE HEALTH INSURANCE | Source: Ambulatory Visit | Attending: General Surgery | Admitting: General Surgery

## 2019-12-18 ENCOUNTER — Encounter (HOSPITAL_COMMUNITY): Payer: Self-pay | Admitting: General Surgery

## 2019-12-18 DIAGNOSIS — Z20822 Contact with and (suspected) exposure to covid-19: Secondary | ICD-10-CM | POA: Insufficient documentation

## 2019-12-18 DIAGNOSIS — Z01812 Encounter for preprocedural laboratory examination: Secondary | ICD-10-CM | POA: Insufficient documentation

## 2019-12-18 NOTE — Progress Notes (Signed)
Denies chest pain, shortness of breath, or cardiology visit. Educated on Museum/gallery conservator. Below information regarding DM provided.   How do I manage my blood sugar before surgery?  Check your blood sugar at least 4 times a day, starting 2 days before surgery, to make sure that the level is not too high or low. o Check your blood sugar the morning of your surgery when you wake up and every 2 hours until you get to the Short Stay unit.  If your blood sugar is less than 70 mg/dL, you will need to treat for low blood sugar: o Do not take insulin. o Treat a low blood sugar (less than 70 mg/dL) with  cup of clear juice (cranberry or apple), 4 glucose tablets, OR glucose gel. Recheck blood sugar in 15 minutes after treatment (to make sure it is greater than 70 mg/dL). If your blood sugar is not greater than 70 mg/dL on recheck, call [phone] o  for further instructions.  Report your blood sugar to the short stay nurse when you get to Short Stay.   If you are admitted to the hospital after surgery: o Your blood sugar will be checked by the staff and you will probably be given insulin after surgery (instead of oral diabetes medicines) to make sure you have good blood sugar levels. o The goal for blood sugar control after surgery is 80-180 mg/dL.    WHAT DO I DO ABOUT MY DIABETES MEDICATION?    Do not take oral diabetes medicines (pills) the morning of surgery.

## 2019-12-19 ENCOUNTER — Ambulatory Visit: Payer: Self-pay | Admitting: General Surgery

## 2019-12-19 LAB — SARS CORONAVIRUS 2 (TAT 6-24 HRS): SARS Coronavirus 2: NEGATIVE

## 2019-12-19 NOTE — H&P (View-Only) (Signed)
History of Present Illness (Maurice Novaleigh Kohlman MD; 12/13/2019 3:30 PM) The patient is a 59 year old male who presents with a complaint of Muscle weakness. Referred by: Dr. Angela Hawkes Chief Complaint: Muscle weakness  Patient is a 59-year-old male comes in secondary to muscle weakness. He stated this began several months ago. He states this has recently gotten worse. He states he was recently ER secondary to muscle weakness. She underwent multiple lab studies which revealed elevated CKs. It appears the patient had more increased proximal muscle weakness. Superficial as well as thighs.  Patient had no other previous surgery.    Past Surgical History (Chanel Nolan, CMA; 12/13/2019 3:10 PM) Appendectomy   Allergies (Chanel Nolan, CMA; 12/13/2019 3:11 PM) No Known Drug Allergies  [12/13/2019]: Allergies Reconciled   Medication History (Chanel Nolan, CMA; 12/13/2019 3:11 PM) Jardiance (10MG Tablet, Oral) Active. predniSONE (20MG Tablet, Oral) Active. Medications Reconciled  Social History (Chanel Nolan, CMA; 12/13/2019 3:10 PM) Alcohol use  Occasional alcohol use. Caffeine use  Coffee, Tea. No drug use  Tobacco use  Former smoker.  Family History (Chanel Nolan, CMA; 12/13/2019 3:10 PM) Arthritis  Sister. Breast Cancer  Sister. Depression  Mother. Diabetes Mellitus  Brother, Sister. Hypertension  Brother, Mother.  Other Problems (Chanel Nolan, CMA; 12/13/2019 3:10 PM) Arthritis  Back Pain  High blood pressure     Review of Systems (Chanel Nolan CMA; 12/13/2019 3:10 PM) General Present- Fatigue. Not Present- Appetite Loss, Chills, Fever, Night Sweats, Weight Gain and Weight Loss. Skin Not Present- Change in Wart/Mole, Dryness, Hives, Jaundice, New Lesions, Non-Healing Wounds, Rash and Ulcer. HEENT Present- Hoarseness and Seasonal Allergies. Not Present- Earache, Hearing Loss, Nose Bleed, Oral Ulcers, Ringing in the Ears, Sinus Pain, Sore Throat, Visual  Disturbances, Wears glasses/contact lenses and Yellow Eyes. Respiratory Not Present- Bloody sputum, Chronic Cough, Difficulty Breathing, Snoring and Wheezing. Cardiovascular Present- Leg Cramps and Swelling of Extremities. Not Present- Chest Pain, Difficulty Breathing Lying Down, Palpitations, Rapid Heart Rate and Shortness of Breath. Gastrointestinal Not Present- Abdominal Pain, Bloating, Bloody Stool, Change in Bowel Habits, Chronic diarrhea, Constipation, Difficulty Swallowing, Excessive gas, Gets full quickly at meals, Hemorrhoids, Indigestion, Nausea, Rectal Pain and Vomiting. Male Genitourinary Present- Frequency and Nocturia. Not Present- Blood in Urine, Change in Urinary Stream, Impotence, Painful Urination, Urgency and Urine Leakage. Musculoskeletal Present- Back Pain, Joint Pain, Joint Stiffness, Muscle Pain, Muscle Weakness and Swelling of Extremities. Neurological Present- Trouble walking and Weakness. Not Present- Decreased Memory, Fainting, Headaches, Numbness, Seizures, Tingling and Tremor. Psychiatric Present- Change in Sleep Pattern. Not Present- Anxiety, Bipolar, Depression, Fearful and Frequent crying. Endocrine Present- New Diabetes. Not Present- Cold Intolerance, Excessive Hunger, Hair Changes, Heat Intolerance and Hot flashes.  Vitals (Chanel Nolan CMA; 12/13/2019 3:11 PM) 12/13/2019 3:11 PM Weight: 151 lb Height: 64in Body Surface Area: 1.74 m Body Mass Index: 25.92 kg/m  Temp.: 97F  Pulse: 125 (Regular)        Physical Exam (Musa Rubi Tooley MD; 12/13/2019 3:30 PM) The physical exam findings are as follows: Note: Constitutional: No acute distress, conversant, appears stated age  Eyes: Anicteric sclerae, moist conjunctiva, no lid lag  Neck: No thyromegaly, trachea midline, no cervical lymphadenopathy  Lungs: Clear to auscultation biilaterally, normal respiratory effot  Cardiovascular: regular rate & rhythm, no murmurs, no peripheal edema, pedal  pulses 2+  GI: Soft, no masses or hepatosplenomegaly, non-tender to palpation  MSK: Normal gait, no clubbing cyanosis, edema  Skin: No rashes, palpation reveals normal skin turgor  Neurologic: Patient with proximal muscle weakness, 3/5   bilateral upper extremities, bilateral lower extremities proximal  Psychiatric: Appropriate judgment and insight, oriented to person, place, and time    Assessment & Plan Axel Filler MD; 12/13/2019 3:31 PM) MUSCLE WEAKNESS (M62.81) Impression: 59 year old male with a history of diabetes, muscle weakness in need of muscle biopsy.  1. Will proceed thyroid and for a proximal thigh muscle biopsy. 2. Discussed the risks and benefits of the procedure to include but not limited to: Infection, bleeding, damages or any structures, possible need for further surgery. Patient was understanding and wished to proceed.

## 2019-12-19 NOTE — H&P (Signed)
History of Present Illness Axel Filler MD; 12/13/2019 3:30 PM) The patient is a 59 year old male who presents with a complaint of Muscle weakness. Referred by: Dr. Zenovia Jordan Chief Complaint: Muscle weakness  Patient is a 59 year old male comes in secondary to muscle weakness. He stated this began several months ago. He states this has recently gotten worse. He states he was recently ER secondary to muscle weakness. She underwent multiple lab studies which revealed elevated CKs. It appears the patient had more increased proximal muscle weakness. Superficial as well as thighs.  Patient had no other previous surgery.    Past Surgical History [facility] Lonni Fix, CMA; 12/13/2019 3:10 PM) Appendectomy   Allergies (Chanel Lonni Fix, CMA; 12/13/2019 3:11 PM) No Known Drug Allergies  [12/13/2019]: Allergies Reconciled   Medication History (Chanel Lonni Fix, CMA; 12/13/2019 3:11 PM) [place] Pepper (10MG  Tablet, Oral) Active. predniSONE (20MG  Tablet, Oral) Active. Medications Reconciled  Social History , CMA; 12/13/2019 3:10 PM) Alcohol use  Occasional alcohol use. Caffeine use  Coffee, Tea. No drug use  Tobacco use  Former smoker.  Family History Micheal Likens, CMA; 12/13/2019 3:10 PM) Arthritis  Sister. Breast Cancer  Sister. Depression  Mother. Diabetes Mellitus  Brother, Sister. Hypertension  Brother, Mother.  Other Problems (Chanel Micheal Likens, CMA; 12/13/2019 3:10 PM) Arthritis  Back Pain  High blood pressure     Review of Systems (Chanel Nolan CMA; 12/13/2019 3:10 PM) General Present- Fatigue. Not Present- Appetite Loss, Chills, Fever, Night Sweats, Weight Gain and Weight Loss. Skin Not Present- Change in Wart/Mole, Dryness, Hives, Jaundice, New Lesions, Non-Healing Wounds, Rash and Ulcer. HEENT Present- Hoarseness and Seasonal Allergies. Not Present- Earache, Hearing Loss, Nose Bleed, Oral Ulcers, Ringing in the Ears, Sinus Pain, Sore Throat, Visual  Disturbances, Wears glasses/contact lenses and Yellow Eyes. Respiratory Not Present- Bloody sputum, Chronic Cough, Difficulty Breathing, Snoring and Wheezing. Cardiovascular Present- Leg Cramps and Swelling of Extremities. Not Present- Chest Pain, Difficulty Breathing Lying Down, Palpitations, Rapid Heart Rate and Shortness of Breath. Gastrointestinal Not Present- Abdominal Pain, Bloating, Bloody Stool, Change in Bowel Habits, Chronic diarrhea, Constipation, Difficulty Swallowing, Excessive gas, Gets full quickly at meals, Hemorrhoids, Indigestion, Nausea, Rectal Pain and Vomiting. Male Genitourinary Present- Frequency and Nocturia. Not Present- Blood in Urine, Change in Urinary Stream, Impotence, Painful Urination, Urgency and Urine Leakage. Musculoskeletal Present- Back Pain, Joint Pain, Joint Stiffness, Muscle Pain, Muscle Weakness and Swelling of Extremities. Neurological Present- Trouble walking and Weakness. Not Present- Decreased Memory, Fainting, Headaches, Numbness, Seizures, Tingling and Tremor. Psychiatric Present- Change in Sleep Pattern. Not Present- Anxiety, Bipolar, Depression, Fearful and Frequent crying. Endocrine Present- New Diabetes. Not Present- Cold Intolerance, Excessive Hunger, Hair Changes, Heat Intolerance and Hot flashes.  Vitals (Chanel Nolan CMA; 12/13/2019 3:11 PM) 12/13/2019 3:11 PM Weight: 151 lb Height: 64in Body Surface Area: 1.74 m Body Mass Index: 25.92 kg/m  Temp.: 45F  Pulse: 125 (Regular)        Physical Exam 12/15/2019 MD; 12/13/2019 3:30 PM) The physical exam findings are as follows: Note: Constitutional: No acute distress, conversant, appears stated age  Eyes: Anicteric sclerae, moist conjunctiva, no lid lag  Neck: No thyromegaly, trachea midline, no cervical lymphadenopathy  Lungs: Clear to auscultation biilaterally, normal respiratory effot  Cardiovascular: regular rate & rhythm, no murmurs, no peripheal edema, pedal  pulses 2+  GI: Soft, no masses or hepatosplenomegaly, non-tender to palpation  MSK: Normal gait, no clubbing cyanosis, edema  Skin: No rashes, palpation reveals normal skin turgor  Neurologic: Patient with proximal muscle weakness, 3/5  bilateral upper extremities, bilateral lower extremities proximal  Psychiatric: Appropriate judgment and insight, oriented to person, place, and time    Assessment & Plan Axel Filler MD; 12/13/2019 3:31 PM) MUSCLE WEAKNESS (M62.81) Impression: 59 year old male with a history of diabetes, muscle weakness in need of muscle biopsy.  1. Will proceed thyroid and for a proximal thigh muscle biopsy. 2. Discussed the risks and benefits of the procedure to include but not limited to: Infection, bleeding, damages or any structures, possible need for further surgery. Patient was understanding and wished to proceed.

## 2019-12-20 ENCOUNTER — Encounter (HOSPITAL_COMMUNITY): Payer: Self-pay | Admitting: General Surgery

## 2019-12-20 ENCOUNTER — Ambulatory Visit (HOSPITAL_COMMUNITY): Payer: 59 | Admitting: Anesthesiology

## 2019-12-20 ENCOUNTER — Other Ambulatory Visit: Payer: Self-pay

## 2019-12-20 ENCOUNTER — Ambulatory Visit (HOSPITAL_COMMUNITY)
Admission: RE | Admit: 2019-12-20 | Discharge: 2019-12-20 | Disposition: A | Discharge status: Home / Self Care | Payer: 59 | Attending: General Surgery | Admitting: General Surgery

## 2019-12-20 ENCOUNTER — Encounter (HOSPITAL_COMMUNITY)
Admission: RE | Disposition: A | Discharge status: Home / Self Care | Payer: Self-pay | Source: Home / Self Care | Attending: General Surgery

## 2019-12-20 DIAGNOSIS — Z87891 Personal history of nicotine dependence: Secondary | ICD-10-CM | POA: Insufficient documentation

## 2019-12-20 DIAGNOSIS — G7289 Other specified myopathies: Secondary | ICD-10-CM | POA: Diagnosis not present

## 2019-12-20 DIAGNOSIS — Z7952 Long term (current) use of systemic steroids: Secondary | ICD-10-CM | POA: Diagnosis not present

## 2019-12-20 DIAGNOSIS — Z7984 Long term (current) use of oral hypoglycemic drugs: Secondary | ICD-10-CM | POA: Diagnosis not present

## 2019-12-20 DIAGNOSIS — M6281 Muscle weakness (generalized): Secondary | ICD-10-CM | POA: Diagnosis present

## 2019-12-20 DIAGNOSIS — E119 Type 2 diabetes mellitus without complications: Secondary | ICD-10-CM | POA: Diagnosis not present

## 2019-12-20 HISTORY — DX: Type 2 diabetes mellitus without complications: E11.9

## 2019-12-20 HISTORY — PX: MUSCLE BIOPSY: SHX716

## 2019-12-20 LAB — BASIC METABOLIC PANEL
Anion gap: 10 (ref 5–15)
BUN: 13 mg/dL (ref 6–20)
CO2: 24 mmol/L (ref 22–32)
Calcium: 8.6 mg/dL — ABNORMAL LOW (ref 8.9–10.3)
Chloride: 104 mmol/L (ref 98–111)
Creatinine, Ser: <0.3 mg/dL — ABNORMAL LOW (ref 0.61–1.24)
Glucose, Bld: 150 mg/dL — ABNORMAL HIGH (ref 70–99)
Potassium: 4.2 mmol/L (ref 3.5–5.1)
Sodium: 138 mmol/L (ref 135–145)

## 2019-12-20 LAB — GLUCOSE, CAPILLARY
Glucose-Capillary: 147 mg/dL — ABNORMAL HIGH (ref 70–99)
Glucose-Capillary: 142 mg/dL — ABNORMAL HIGH (ref 70–99)
Glucose-Capillary: 139 mg/dL — ABNORMAL HIGH (ref 70–99)

## 2019-12-20 SURGERY — MUSCLE BIOPSY
Anesthesia: General | Site: Thigh | Laterality: Right

## 2019-12-20 MED ORDER — DEXAMETHASONE SODIUM PHOSPHATE 10 MG/ML IJ SOLN
INTRAMUSCULAR | Status: DC | PRN
  Administered 2019-12-20: 5 mg via INTRAVENOUS

## 2019-12-20 MED ORDER — PHENYLEPHRINE 40 MCG/ML (10ML) SYRINGE FOR IV PUSH (FOR BLOOD PRESSURE SUPPORT)
PREFILLED_SYRINGE | INTRAVENOUS | Status: AC
  Filled 2019-12-20: qty 10

## 2019-12-20 MED ORDER — CHLORHEXIDINE GLUCONATE 0.12 % MT SOLN: 15.0000 mL | Freq: Once | OROMUCOSAL | Status: AC

## 2019-12-20 MED ORDER — MIDAZOLAM HCL 2 MG/2ML IJ SOLN
INTRAMUSCULAR | Status: AC
  Filled 2019-12-20: qty 2

## 2019-12-20 MED ORDER — LIDOCAINE 2% (20 MG/ML) 5 ML SYRINGE
INTRAMUSCULAR | Status: DC | PRN
  Administered 2019-12-20: 60 mg via INTRAVENOUS

## 2019-12-20 MED ORDER — BUPIVACAINE HCL (PF) 0.25 % IJ SOLN
INTRAMUSCULAR | Status: AC
  Filled 2019-12-20: qty 30

## 2019-12-20 MED ORDER — DEXAMETHASONE SODIUM PHOSPHATE 10 MG/ML IJ SOLN
INTRAMUSCULAR | Status: AC
  Filled 2019-12-20: qty 1

## 2019-12-20 MED ORDER — LACTATED RINGERS IV SOLN: INTRAVENOUS | Status: DC

## 2019-12-20 MED ORDER — PHENYLEPHRINE 40 MCG/ML (10ML) SYRINGE FOR IV PUSH (FOR BLOOD PRESSURE SUPPORT)
PREFILLED_SYRINGE | INTRAVENOUS | Status: DC | PRN
  Administered 2019-12-20 (×2): 80 ug via INTRAVENOUS

## 2019-12-20 MED ORDER — ACETAMINOPHEN 500 MG PO TABS
1000.0000 mg | ORAL_TABLET | ORAL | Status: AC
  Administered 2019-12-20: 1000 mg via ORAL
  Filled 2019-12-20: qty 2

## 2019-12-20 MED ORDER — CHLORHEXIDINE GLUCONATE 0.12 % MT SOLN
OROMUCOSAL | Status: AC
  Administered 2019-12-20: 15 mL via OROMUCOSAL
  Filled 2019-12-20: qty 15

## 2019-12-20 MED ORDER — CHLORHEXIDINE GLUCONATE CLOTH 2 % EX PADS: 6.0000 | MEDICATED_PAD | Freq: Once | CUTANEOUS | Status: DC

## 2019-12-20 MED ORDER — ENSURE PRE-SURGERY PO LIQD: 296.0000 mL | Freq: Once | ORAL | Status: DC

## 2019-12-20 MED ORDER — CELECOXIB 200 MG PO CAPS
200.0000 mg | ORAL_CAPSULE | Freq: Once | ORAL | Status: AC
  Administered 2019-12-20: 200 mg via ORAL
  Filled 2019-12-20: qty 1

## 2019-12-20 MED ORDER — TRAMADOL HCL 50 MG PO TABS: 50.0000 mg | ORAL_TABLET | Freq: Four times a day (QID) | ORAL | 0 refills | Status: AC | PRN

## 2019-12-20 MED ORDER — LIDOCAINE 2% (20 MG/ML) 5 ML SYRINGE
INTRAMUSCULAR | Status: AC
  Filled 2019-12-20: qty 5

## 2019-12-20 MED ORDER — PROPOFOL 10 MG/ML IV BOLUS
INTRAVENOUS | Status: DC | PRN
  Administered 2019-12-20: 150 mg via INTRAVENOUS

## 2019-12-20 MED ORDER — BUPIVACAINE HCL (PF) 0.25 % IJ SOLN
INTRAMUSCULAR | Status: DC | PRN
  Administered 2019-12-20: 10 mL

## 2019-12-20 MED ORDER — CEFAZOLIN SODIUM-DEXTROSE 2-4 GM/100ML-% IV SOLN
2.0000 g | INTRAVENOUS | Status: AC
  Administered 2019-12-20: 2 g via INTRAVENOUS
  Filled 2019-12-20: qty 100

## 2019-12-20 MED ORDER — ORAL CARE MOUTH RINSE: 15.0000 mL | Freq: Once | OROMUCOSAL | Status: AC

## 2019-12-20 MED ORDER — ONDANSETRON HCL 4 MG/2ML IJ SOLN
INTRAMUSCULAR | Status: DC | PRN
  Administered 2019-12-20: 4 mg via INTRAVENOUS

## 2019-12-20 MED ORDER — MIDAZOLAM HCL 5 MG/5ML IJ SOLN
INTRAMUSCULAR | Status: DC | PRN
  Administered 2019-12-20: 2 mg via INTRAVENOUS

## 2019-12-20 MED ORDER — FENTANYL CITRATE (PF) 250 MCG/5ML IJ SOLN
INTRAMUSCULAR | Status: DC | PRN
Start: 2019-12-20 — End: 2019-12-20
  Administered 2019-12-20: 50 ug via INTRAVENOUS

## 2019-12-20 MED ORDER — FENTANYL CITRATE (PF) 250 MCG/5ML IJ SOLN
INTRAMUSCULAR | Status: AC
  Filled 2019-12-20: qty 5

## 2019-12-20 MED ORDER — ONDANSETRON HCL 4 MG/2ML IJ SOLN
INTRAMUSCULAR | Status: AC
  Filled 2019-12-20: qty 2

## 2019-12-20 MED ORDER — PROPOFOL 10 MG/ML IV BOLUS
INTRAVENOUS | Status: AC
  Filled 2019-12-20: qty 20

## 2019-12-20 SURGICAL SUPPLY — 35 items
CHLORAPREP W/TINT 10.5 ML (MISCELLANEOUS) ×3 IMPLANT
CNTNR URN SCR LID CUP LEK RST (MISCELLANEOUS) ×2 IMPLANT
CONT SPEC 4OZ STRL OR WHT (MISCELLANEOUS) ×4
COVER SURGICAL LIGHT HANDLE (MISCELLANEOUS) ×3 IMPLANT
COVER WAND RF STERILE (DRAPES) ×3 IMPLANT
DECANTER SPIKE VIAL GLASS SM (MISCELLANEOUS) ×3 IMPLANT
DERMABOND ADVANCED (GAUZE/BANDAGES/DRESSINGS) ×2
DERMABOND ADVANCED .7 DNX12 (GAUZE/BANDAGES/DRESSINGS) ×1 IMPLANT
DRAPE LAPAROTOMY 100X72 PEDS (DRAPES) ×3 IMPLANT
DRSG TELFA 3X8 NADH (GAUZE/BANDAGES/DRESSINGS) ×3 IMPLANT
ELECT REM PT RETURN 9FT ADLT (ELECTROSURGICAL) ×3
ELECTRODE REM PT RTRN 9FT ADLT (ELECTROSURGICAL) ×1 IMPLANT
GAUZE 4X4 16PLY RFD (DISPOSABLE) ×3 IMPLANT
GLOVE BIO SURGEON STRL SZ7.5 (GLOVE) ×3 IMPLANT
GLOVE BIOGEL PI IND STRL 8 (GLOVE) ×1 IMPLANT
GLOVE BIOGEL PI INDICATOR 8 (GLOVE) ×2
GOWN STRL REUS W/ TWL LRG LVL3 (GOWN DISPOSABLE) ×1 IMPLANT
GOWN STRL REUS W/ TWL XL LVL3 (GOWN DISPOSABLE) ×1 IMPLANT
GOWN STRL REUS W/TWL LRG LVL3 (GOWN DISPOSABLE) ×2
GOWN STRL REUS W/TWL XL LVL3 (GOWN DISPOSABLE) ×2
KIT BASIN OR (CUSTOM PROCEDURE TRAY) ×3 IMPLANT
KIT TURNOVER KIT B (KITS) ×3 IMPLANT
NEEDLE HYPO 25GX1X1/2 BEV (NEEDLE) ×3 IMPLANT
NS IRRIG 1000ML POUR BTL (IV SOLUTION) ×3 IMPLANT
PACK GENERAL/GYN (CUSTOM PROCEDURE TRAY) ×3 IMPLANT
PAD ARMBOARD 7.5X6 YLW CONV (MISCELLANEOUS) ×3 IMPLANT
PENCIL SMOKE EVACUATOR (MISCELLANEOUS) ×3 IMPLANT
SUT MNCRL AB 4-0 PS2 18 (SUTURE) ×3 IMPLANT
SUT SILK 2 0 (SUTURE) ×2
SUT SILK 2-0 18XBRD TIE 12 (SUTURE) ×1 IMPLANT
SUT VIC AB 3-0 SH 27 (SUTURE) ×4
SUT VIC AB 3-0 SH 27X BRD (SUTURE) ×2 IMPLANT
SYR CONTROL 10ML LL (SYRINGE) ×3 IMPLANT
TOWEL GREEN STERILE (TOWEL DISPOSABLE) ×3 IMPLANT
TOWEL GREEN STERILE FF (TOWEL DISPOSABLE) ×3 IMPLANT

## 2019-12-20 NOTE — Anesthesia Preprocedure Evaluation (Addendum)
Anesthesia Evaluation  Patient identified by MRN, date of birth, ID band Patient awake    Reviewed: Allergy & Precautions, NPO status , Patient's Chart, lab work & pertinent test results  History of Anesthesia Complications Negative for: history of anesthetic complications  Airway Mallampati: II  TM Distance: >3 FB Neck ROM: Full    Dental no notable dental hx. (+) Dental Advisory Given   Pulmonary neg pulmonary ROS,    Pulmonary exam normal        Cardiovascular negative cardio ROS Normal cardiovascular exam     Neuro/Psych negative neurological ROS     GI/Hepatic negative GI ROS, Neg liver ROS,   Endo/Other  diabetes  Renal/GU negative Renal ROS     Musculoskeletal negative musculoskeletal ROS (+)   Abdominal   Peds  Hematology negative hematology ROS (+)   Anesthesia Other Findings   Reproductive/Obstetrics                            Anesthesia Physical Anesthesia Plan  ASA: II  Anesthesia Plan: General   Post-op Pain Management:    Induction: Intravenous  PONV Risk Score and Plan: 3 and Ondansetron, Dexamethasone and Midazolam  Airway Management Planned: LMA  Additional Equipment:   Intra-op Plan:   Post-operative Plan: Extubation in OR  Informed Consent: I have reviewed the patients History and Physical, chart, labs and discussed the procedure including the risks, benefits and alternatives for the proposed anesthesia with the patient or authorized representative who has indicated his/her understanding and acceptance.     Dental advisory given  Plan Discussed with: Anesthesiologist and CRNA  Anesthesia Plan Comments:        Anesthesia Quick Evaluation

## 2019-12-20 NOTE — Anesthesia Postprocedure Evaluation (Signed)
Anesthesia Post Note  Patient: Maurice Jones  Procedure(s) Performed: RIGHT THIGH MUSCLE BIOPSY (Right Thigh)     Patient location during evaluation: PACU Anesthesia Type: General Level of consciousness: awake and alert Pain management: pain level controlled Vital Signs Assessment: post-procedure vital signs reviewed and stable Respiratory status: spontaneous breathing, nonlabored ventilation and respiratory function stable Cardiovascular status: blood pressure returned to baseline and stable Postop Assessment: no apparent nausea or vomiting Anesthetic complications: no   No complications documented.  Last Vitals:  Vitals:   12/20/19 1545 12/20/19 1600  BP: 118/80   Pulse: 66 (!) 59  Resp: (!) 22 17  Temp:  (!) 36.3 C  SpO2: 99% 98%    Last Pain:  Vitals:   12/20/19 1600  TempSrc:   PainSc: 0-No pain                 Aleria Maheu,W. EDMOND

## 2019-12-20 NOTE — Op Note (Signed)
12/20/2019  3:19 PM  PATIENT:  Maurice Jones  59 y.o. male  PRE-OPERATIVE DIAGNOSIS:  MUSCLE WEAKNESS  POST-OPERATIVE DIAGNOSIS:  MUSCLE WEAKNESS  PROCEDURE:  Procedure(s): RIGHT THIGH MUSCLE BIOPSY (Right)  SURGEON:  Surgeon(s) and Role:    Axel Filler, MD - Primary  ANESTHESIA:   local and general  EBL:  minimal   BLOOD ADMINISTERED:none  DRAINS: none   LOCAL MEDICATIONS USED:  BUPIVICAINE   SPECIMEN:  Source of Specimen:  Right thigh muscle biopsy  DISPOSITION OF SPECIMEN:  PATHOLOGY  COUNTS:  YES  TOURNIQUET:  * No tourniquets in log *  DICTATION: .Dragon Dictation  The patient was taken to the OR and placed in the supine position with bilateral SCDs in place. The patient was prepped and draped in the usual sterile fashion. A time out was called and all facts were verified.   A 3cm incision was made over his R quadriceps muscle. Sharp dissection was taken down to his fascia. This was incised. A measrued muscle of 2.5x 2cm was Isolated with hemostats. This was sharply excised. Each cut end was tied off with 2-0 silk ties. The specimen was sent to pathology.   I checked for hemostasis and it was excellent. 3-0 vicryl were used to reapproximate the fascial layer. 0.25% bupivicaine was used to infiltrate the incision site.  4-0 monocryls were used to reapproximate the skin in a subcuticular fashion. Dermabond was used as a dressing.   The patient tolerated the procedure well. He was taken to the recovery room in stable condition.    PLAN OF CARE: Discharge to home after PACU  PATIENT DISPOSITION:  PACU - hemodynamically stable.   Delay start of Pharmacological VTE agent (>24hrs) due to surgical blood loss or risk of bleeding: not applicable

## 2019-12-20 NOTE — Interval H&P Note (Signed)
History and Physical Interval Note:  12/20/2019 2:21 PM  Maurice Jones  has presented today for surgery, with the diagnosis of MUSCLE WEAKNESS.  The various methods of treatment have been discussed with the patient and family. After consideration of risks, benefits and other options for treatment, the patient has consented to  Procedure(s): RIGHT THIGH MUSCLE BIOPSY (Right) as a surgical intervention.  The patient's history has been reviewed, patient examined, no change in status, stable for surgery.  I have reviewed the patient's chart and labs.  Questions were answered to the patient's satisfaction.     Axel Filler

## 2019-12-20 NOTE — Discharge Instructions (Signed)
Muscle Biopsy, Care After This sheet gives you information about how to care for yourself after your procedure. Your health care provider may also give you more specific instructions. If you have problems or questions, contact your health care provider. What can I expect after the procedure? After your procedure, it is common to have soreness and tenderness at the site of the biopsy. This may last for a few days. Follow these instructions at home: Biopsy site care   Follow instructions from your health care provider about how to take care of your biopsy site. Make sure you: ? Wash your hands with soap and water before and after you change your bandage (dressing). If soap and water are not available, use hand sanitizer. ? Change your dressing as told by your health care provider. ? Leave stitches (sutures), skin glue, or adhesive strips in place. These skin closures may need to stay in place for 2 weeks or longer. If adhesive strip edges start to loosen and curl up, you may trim the loose edges. Do not remove adhesive strips completely unless your health care provider tells you to do that.  Check your biopsy site every day for signs of infection. Check for: ? Redness, swelling, or pain. ? Fluid or blood. ? Warmth. ? Pus or a bad smell. Activity  Do not drive for 24 hours if you were given a sedative during your procedure.  Return to your normal activities as told by your health care provider. Ask your health care provider what activities are safe for you. General instructions  Take over-the-counter and prescription medicines only as told by your health care provider.  Do not take baths, swim, or use a hot tub until your health care provider approves. Ask your health care provider if you may take showers. You may only be allowed to take sponge baths.  Keep all follow-up visits as told by your health care provider. This is important. Contact a health care provider if:  You have redness,  swelling, or pain around your biopsy site.  You have fluid or blood coming from your biopsy site.  Your biopsy site feels warm to the touch.  You have pus or a bad smell coming from your biopsy site.  You have a fever.  You are light-headed or you feel faint. Get help right away if you:  Develop a rash.  Have trouble breathing.  Have numbness or tingling going down the arm or leg of your biopsy site. Summary  After your procedure, it is common to have soreness and tenderness at the site of the biopsy.  Wash your hands before and after you change your dressing. Leave stitches, skin glue, and adhesive strips in place.  Return to your normal activities as told by your health care provider.  Contact a health care provider if you have redness, swelling, or pain around your biopsy site.  Get help right away if you develop a rash, have trouble breathing, or have numbness and tingling down your arm or leg. This information is not intended to replace advice given to you by your health care provider. Make sure you discuss any questions you have with your health care provider. Document Revised: 02/08/2018 Document Reviewed: 02/08/2018 Elsevier Patient Education  2020 [employer]  

## 2019-12-20 NOTE — Transfer of Care (Signed)
Immediate Anesthesia Transfer of Care Note  Patient: Daryel Gerald  Procedure(s) Performed: RIGHT THIGH MUSCLE BIOPSY (Right Thigh)  Patient Location: PACU  Anesthesia Type:General  Level of Consciousness: awake, alert  and oriented  Airway & Oxygen Therapy: Patient Spontanous Breathing  Post-op Assessment: Report given to RN, Post -op Vital signs reviewed and stable and Patient moving all extremities X 4  Post vital signs: Reviewed and stable  Last Vitals:  Vitals Value Taken Time  BP 101/64 12/20/19 1530  Temp    Pulse 92 12/20/19 1530  Resp 10 12/20/19 1530  SpO2 98 % 12/20/19 1530    Last Pain:  Vitals:   12/20/19 1530  TempSrc:   PainSc: 0-No pain      Patients Stated Pain Goal: 4 (12/20/19 1234)  Complications: No complications documented.

## 2019-12-20 NOTE — Anesthesia Procedure Notes (Signed)
Procedure Name: LMA Insertion Date/Time: 12/20/2019 2:55 PM Performed by: Quentin Ore, CRNA Pre-anesthesia Checklist: Patient identified, Emergency Drugs available, Suction available and Patient being monitored Patient Re-evaluated:Patient Re-evaluated prior to induction Oxygen Delivery Method: Circle system utilized Preoxygenation: Pre-oxygenation with 100% oxygen Induction Type: IV induction LMA: LMA inserted LMA Size: 4.0 Number of attempts: 1 Placement Confirmation: positive ETCO2 and breath sounds checked- equal and bilateral Tube secured with: Tape Dental Injury: Teeth and Oropharynx as per pre-operative assessment

## 2019-12-21 ENCOUNTER — Encounter (HOSPITAL_COMMUNITY): Payer: Self-pay | Admitting: General Surgery

## 2020-01-04 ENCOUNTER — Encounter (HOSPITAL_COMMUNITY): Payer: Self-pay

## 2020-01-04 LAB — SURGICAL PATHOLOGY

## 2020-01-08 ENCOUNTER — Ambulatory Visit: Payer: PRIVATE HEALTH INSURANCE | Admitting: Diagnostic Neuroimaging

## 2020-02-12 ENCOUNTER — Ambulatory Visit: Payer: PRIVATE HEALTH INSURANCE | Admitting: Physical Therapy

## 2021-06-15 IMAGING — MR MR HEAD W/O CM
12 of 13 series · 44 of 48 positions shown · non-contrast
Comparison: None.

CLINICAL DATA: Difficulty lifting right arm for 1 month.  Weakness.

EXAM:
MRI HEAD WITHOUT CONTRAST
MRI CERVICAL SPINE WITHOUT CONTRAST
TECHNIQUE: Multiplanar, multiecho pulse sequences of the brain and surrounding
structures, and cervical spine, to include the craniocervical
junction and cervicothoracic junction, were obtained without
intravenous contrast.

[Series 5: DWI · axial · 3.0mm · 0.88mm/px · z∈[-59,+86]mm · 8 of 100 slices shown (1 of 4)]
[im 1/100]
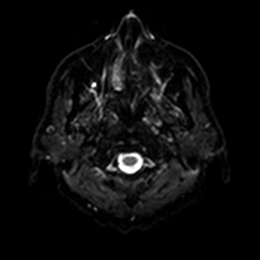
[im 15/100]
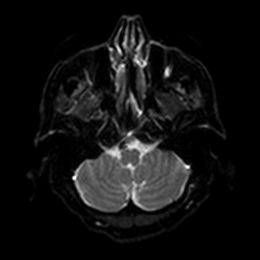
[im 29/100]
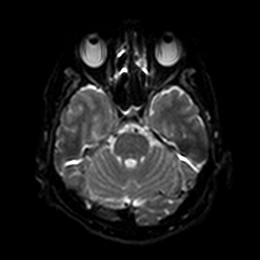
[im 43/100]
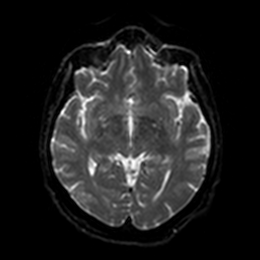
[im 57/100]
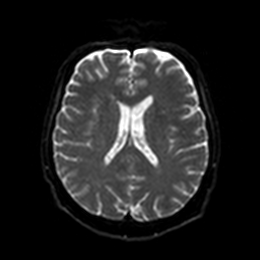
[im 71/100]
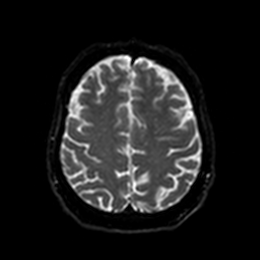
[im 85/100]
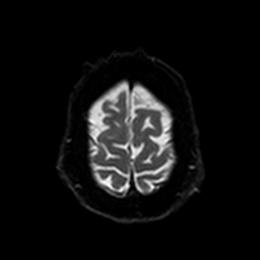
[im 100/100]
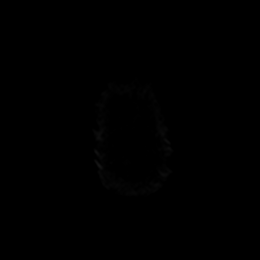

[Series 6: DWI · axial · 3.0mm · 0.88mm/px · z∈[-59,+86]mm · 4 of 49 slices shown (2 of 4)]
[im 1/49]
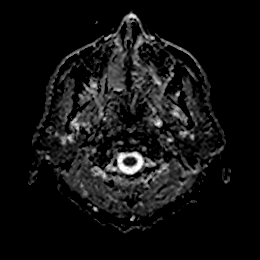
[im 17/49]
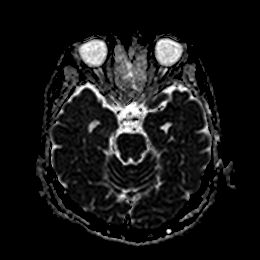
[im 33/49]
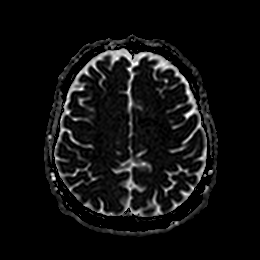
[im 49/49]
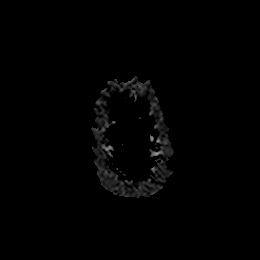

[Series 7: DWI · coronal · 4.0mm · 0.88mm/px · 5 of 66 slices shown (3 of 4)]
[im 1/66]
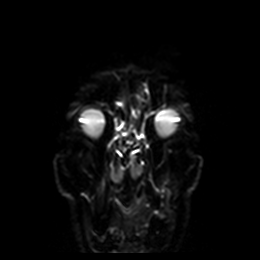
[im 17/66]
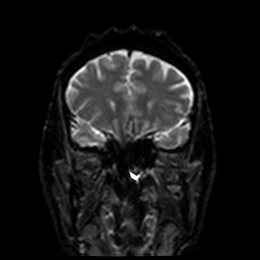
[im 33/66]
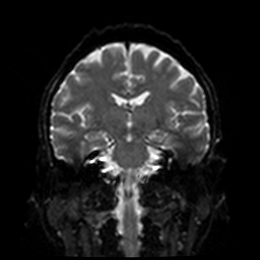
[im 49/66]
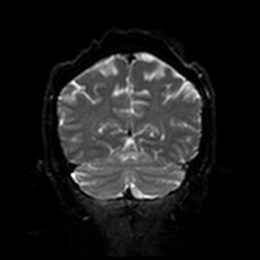
[im 66/66]
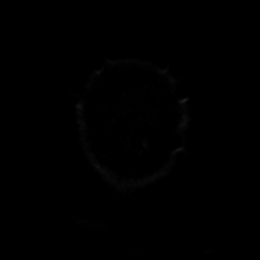

[Series 8: DWI · coronal · 4.0mm · 0.88mm/px · 3 of 33 slices shown (4 of 4)]
[im 1/33]
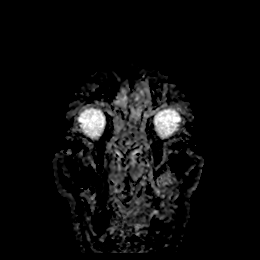
[im 17/33]
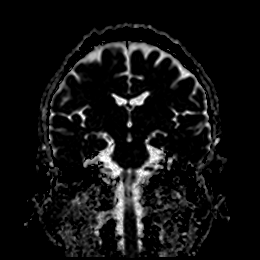
[im 33/33]
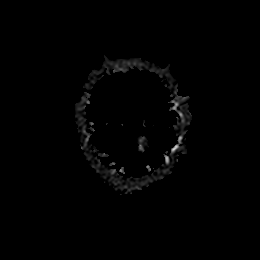

[Series 9: T1 · sagittal · 5.0mm · 0.75mm/px · 2 of 27 slices shown]
[im 1/27]
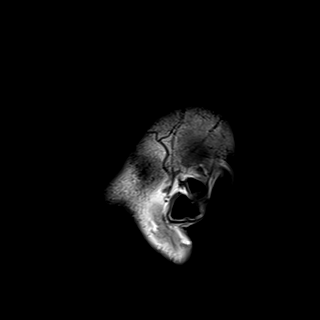
[im 27/27]
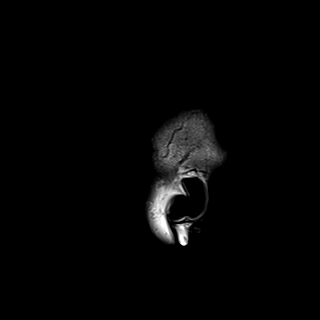

[Series 10: T2 · axial · 5.0mm · 0.72mm/px · z∈[-68,+86]mm · 2 of 27 slices shown (1 of 2)]
[im 1/27]
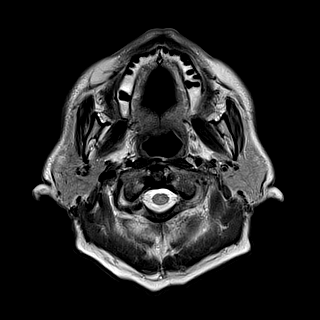
[im 27/27]
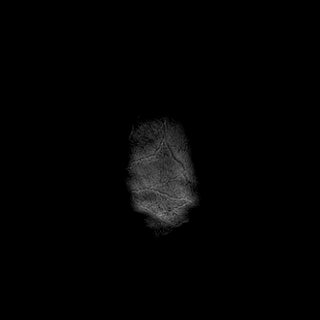

[Series 11: FLAIR · axial · 5.0mm · 0.45mm/px · z∈[-68,+86]mm · 2 of 27 slices shown]
[im 1/27]
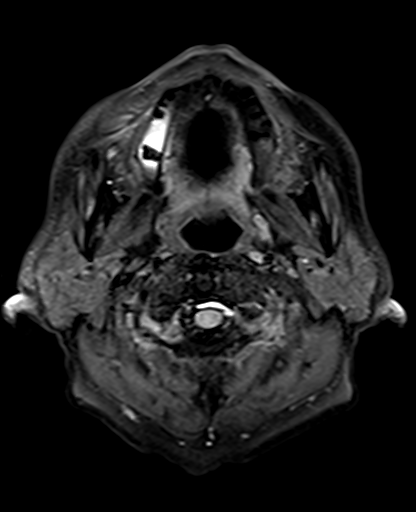
[im 27/27]
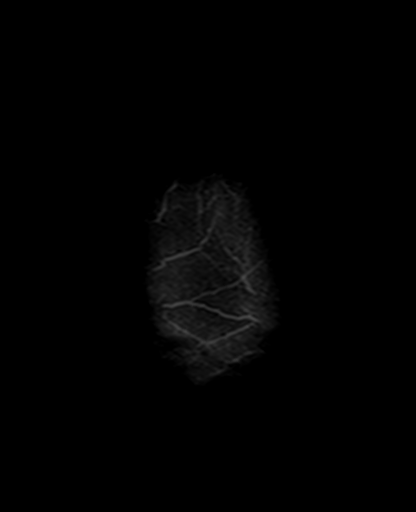

[Series 12: mag_images · axial · 3.0mm · 0.90mm/px · z∈[-72,+91]mm · 4 of 56 slices shown]
[im 1/56]
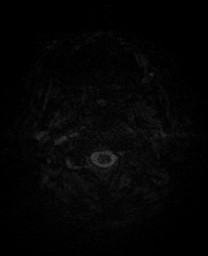
[im 19/56]
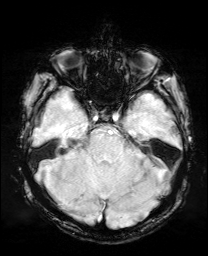
[im 37/56]
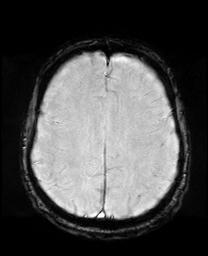
[im 56/56]
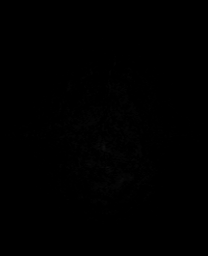

[Series 13: pha_images · axial · 3.0mm · 0.90mm/px · z∈[-72,+88]mm · 4 of 54 slices shown]
[im 1/54]
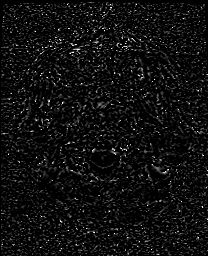
[im 18/54]
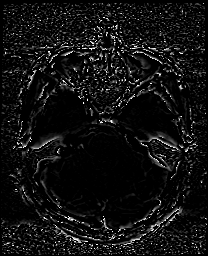
[im 36/54]
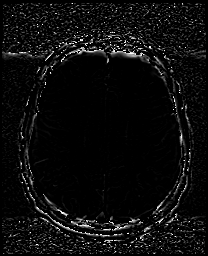
[im 54/54]
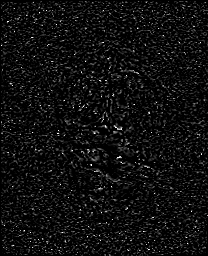

[Series 14: swi_images · axial · 3.0mm · 0.90mm/px · z∈[-72,+91]mm · 4 of 56 slices shown]
[im 1/56]
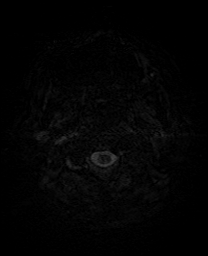
[im 19/56]
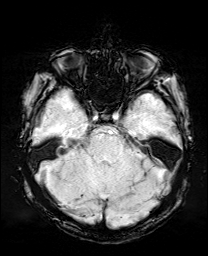
[im 37/56]
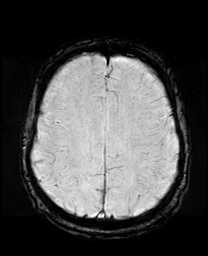
[im 56/56]
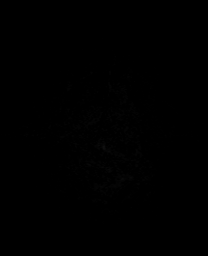

[Series 15: mip_images(sw) · axial · 24.0mm · 0.90mm/px · z∈[-62,+80]mm · 4 of 49 slices shown]
[im 1/49]
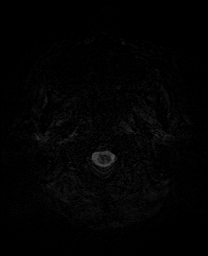
[im 17/49]
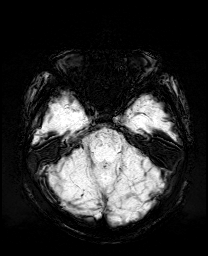
[im 33/49]
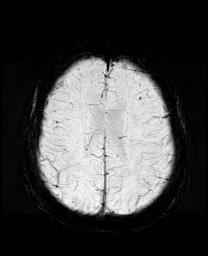
[im 49/49]
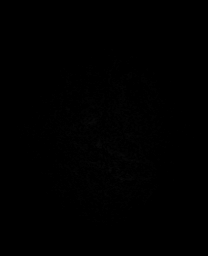

[Series 17: T2 · coronal · 5.0mm · 0.34mm/px · 2 of 29 slices shown (2 of 2)]
[im 1/29]
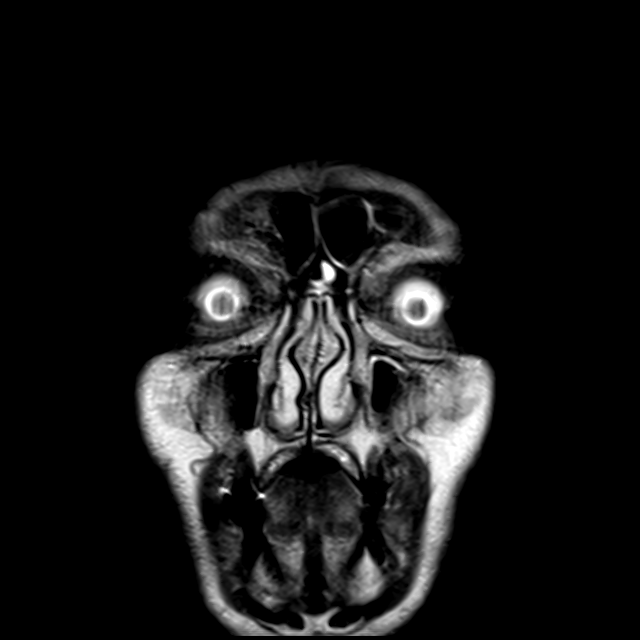
[im 29/29]
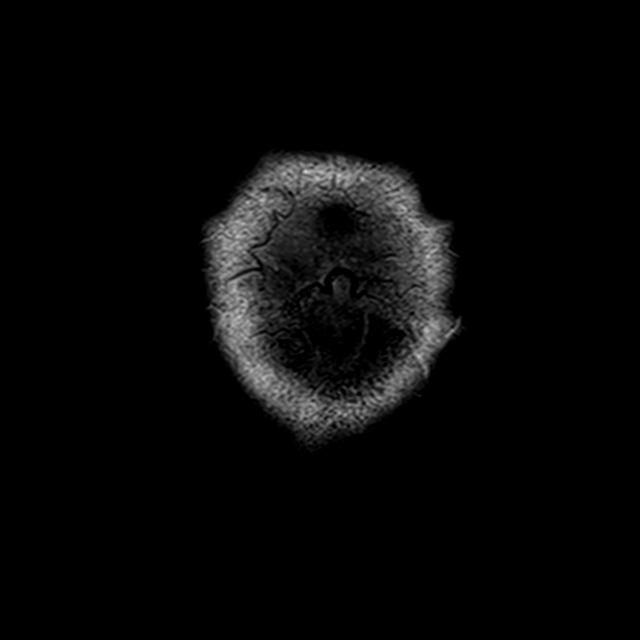

[44 of 48 positions shown; findings below may reference images not displayed]

FINDINGS: MRI HEAD FINDINGS

BRAIN: No acute infarct, acute hemorrhage or extra-axial collection.
Normal white matter signal. Normal volume of CSF spaces. No chronic
microhemorrhage. Normal midline structures.

VASCULAR: Major flow voids are preserved.

SKULL AND UPPER CERVICAL SPINE: Normal calvarium and skull base.
Visualized upper cervical spine and soft tissues are normal.

SINUSES/ORBITS: No paranasal sinus fluid levels or advanced mucosal
thickening. No mastoid or middle ear effusion. Normal orbits.

MRI CERVICAL SPINE FINDINGS

Alignment: Physiologic.

Vertebrae: No fracture, evidence of discitis, or bone lesion.

Cord: Normal signal and morphology.

Posterior Fossa, vertebral arteries, paraspinal tissues: Negative.

Disc levels: C2-3: Normal.

C3-4: Small right subarticular disc protrusion. Mild right foraminal
stenosis. No central spinal canal stenosis.

C4-5: Small disc bulge.  Moderate right foraminal stenosis.

C5-6: Small disc bulge.  No stenosis.

C6-7: Unremarkable.

C7-T1: Normal.

T1-2: Normal.
IMPRESSION: 1. Normal MRI of the brain.
2. Moderate right C5 and mild right C4 neural foraminal stenosis.

## 2022-12-29 DEATH — deceased
